# Patient Record
Sex: Male | Born: 1985 | Race: Black or African American | Hispanic: No | Smoking: Current every day smoker
Health system: Southern US, Community
[De-identification: ages and names within clinical notes are randomized; demographics above are authoritative.]

## PROBLEM LIST (undated history)

## (undated) DIAGNOSIS — J40 Bronchitis, not specified as acute or chronic: Secondary | ICD-10-CM

## (undated) DIAGNOSIS — I10 Essential (primary) hypertension: Secondary | ICD-10-CM

## (undated) DIAGNOSIS — Z973 Presence of spectacles and contact lenses: Secondary | ICD-10-CM

## (undated) DIAGNOSIS — L309 Dermatitis, unspecified: Secondary | ICD-10-CM

## (undated) DIAGNOSIS — M1990 Unspecified osteoarthritis, unspecified site: Secondary | ICD-10-CM

## (undated) DIAGNOSIS — F4310 Post-traumatic stress disorder, unspecified: Secondary | ICD-10-CM

## (undated) DIAGNOSIS — K604 Rectal fistula: Secondary | ICD-10-CM

## (undated) DIAGNOSIS — R011 Cardiac murmur, unspecified: Secondary | ICD-10-CM

## (undated) DIAGNOSIS — G43909 Migraine, unspecified, not intractable, without status migrainosus: Secondary | ICD-10-CM

## (undated) DIAGNOSIS — R21 Rash and other nonspecific skin eruption: Secondary | ICD-10-CM

## (undated) HISTORY — PX: HERNIA REPAIR: SHX51

---

## 2010-03-16 ENCOUNTER — Emergency Department (HOSPITAL_COMMUNITY): Admission: EM | Admit: 2010-03-16 | Discharge: 2010-03-16 | Payer: Self-pay | Admitting: Emergency Medicine

## 2010-05-24 ENCOUNTER — Emergency Department (HOSPITAL_COMMUNITY): Admission: EM | Admit: 2010-05-24 | Discharge: 2010-05-24 | Payer: Self-pay | Admitting: Emergency Medicine

## 2010-07-24 ENCOUNTER — Emergency Department (HOSPITAL_COMMUNITY): Admission: EM | Admit: 2010-07-24 | Discharge: 2010-07-24 | Payer: Self-pay | Admitting: Family Medicine

## 2010-11-20 ENCOUNTER — Inpatient Hospital Stay (INDEPENDENT_AMBULATORY_CARE_PROVIDER_SITE_OTHER)
Admission: RE | Admit: 2010-11-20 | Discharge: 2010-11-20 | Disposition: A | Discharge status: Home / Self Care | Source: Ambulatory Visit | Attending: Emergency Medicine | Admitting: Emergency Medicine

## 2010-11-20 DIAGNOSIS — B356 Tinea cruris: Secondary | ICD-10-CM

## 2010-11-20 DIAGNOSIS — S60219A Contusion of unspecified wrist, initial encounter: Secondary | ICD-10-CM

## 2018-10-28 ENCOUNTER — Ambulatory Visit (HOSPITAL_COMMUNITY)
Admission: EM | Admit: 2018-10-28 | Discharge: 2018-10-28 | Disposition: A | Discharge status: Home / Self Care | Payer: No Typology Code available for payment source | Attending: Family Medicine | Admitting: Family Medicine

## 2018-10-28 ENCOUNTER — Other Ambulatory Visit: Payer: Self-pay

## 2018-10-28 ENCOUNTER — Encounter (HOSPITAL_COMMUNITY): Payer: Self-pay

## 2018-10-28 DIAGNOSIS — L739 Follicular disorder, unspecified: Secondary | ICD-10-CM | POA: Diagnosis not present

## 2018-10-28 MED ORDER — SULFAMETHOXAZOLE-TRIMETHOPRIM 800-160 MG PO TABS: 1.0000 | ORAL_TABLET | Freq: Two times a day (BID) | ORAL | 0 refills | Status: AC

## 2018-10-28 NOTE — ED Triage Notes (Signed)
Pt cc abscess right side of his buttock since Monday.

## 2018-10-28 NOTE — ED Notes (Signed)
Patient able to ambulate independently  

## 2018-10-29 NOTE — ED Provider Notes (Signed)
  [facility]   [id number] 10/28/18 Arrival Time: 1631  ASSESSMENT & PLAN:  1. Folliculitis    No sign of an abscess requiring I&D today. Discussed.  Will start: Meds ordered this encounter  Medications  . sulfamethoxazole-trimethoprim (BACTRIM DS,SEPTRA DS) 800-160 MG tablet    Sig: Take 1 tablet by mouth 2 (two) times daily for 10 days.    Dispense:  20 tablet    Refill:  0   OTC analgesics as needed.  Follow-up Information    [facility].   Specialty:  Urgent Care Why:  If symptoms worsen. Contact information: [address] [place] [address] [phone]         Reviewed expectations re: course of current medical issues. Questions answered. Outlined signs and symptoms indicating need for more acute intervention. Patient verbalized understanding. After Visit Summary given.   SUBJECTIVE:  Jake Bonilla is a 33 y.o. male who presents with a possible infection of his inner R buttock. Onset gradual, approximately 2 days ago without active drainage and without active bleeding. Mild pain. Symptoms have stabilized since beginning. Fever: absent. OTC/home treatment: none.  ROS: As per HPI.  OBJECTIVE:  Vitals:   10/28/18 1713 10/28/18 1717  BP:  (!) 155/80  Pulse:  68  Resp:  18  Temp:  98.3 F (36.8 C)  TempSrc:  Oral  SpO2:  100%  Weight: 104.3 kg      General appearance: alert; no distress Skin: R inner buttock with two small indurations approx 0.5cm in size; no fluctuance; mildly tender to touch; no active drainage or bleeding Psychological: alert and cooperative; normal mood and affect  No Known Allergies   Social History   Socioeconomic History  . Marital status: Married    Spouse name: Not on file  . Number of children: Not on file  . Years of education: Not on file  . Highest education level: Not on file  Occupational History  . Not on file  Social Needs  . Financial resource  strain: Not on file  . Food insecurity:    Worry: Not on file    Inability: Not on file  . Transportation needs:    Medical: Not on file    Non-medical: Not on file  Tobacco Use  . Smoking status: Current Every Day Smoker    Packs/day: 0.50  . Smokeless tobacco: Never Used  Substance and Sexual Activity  . Alcohol use: Never    Frequency: Never  . Drug use: Never  . Sexual activity: Not on file  Lifestyle  . Physical activity:    Days per week: Not on file    Minutes per session: Not on file  . Stress: Not on file  Relationships  . Social connections:    Talks on phone: Not on file    Gets together: Not on file    Attends religious service: Not on file    Active member of club or organization: Not on file    Attends meetings of clubs or organizations: Not on file    Relationship status: Not on file  Other Topics Concern  . Not on file  Social History Narrative  . Not on file   History reviewed. No pertinent family history. History reviewed. No pertinent surgical history.         Mardella Layman, MD 10/29/18 [phone]

## 2019-04-14 ENCOUNTER — Ambulatory Visit: Payer: Self-pay | Admitting: Surgery

## 2019-04-14 NOTE — H&P (View-Only) (Signed)
Luay Balding Documented: 04/14/2019 8:38 AM Location: [facility] Patient #: [id number] DOB: 12-04-1985 Separated / Language: Cleophus Molt / Race: Black or African American Male  History of Present Illness Adin Hector MD; 04/14/2019 1:51 PM) The patient is a 33 year old male who presents with a subcutaneous abscess. 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 He is seen dermatology. He has been treated with steroid creams and topical and oral antibiotics with persistent drainage. Surgical consultation requested. Feels like he's had subcutaneous absences intermittently over decade. However he has persistent recurrent one on his right inner buttock cheek. His reading of 100th maybe he had hidradenitis. No problems in his armpits or in his groins. He does feel a little painful cyst to the right of the scrotum in his inner thigh crease. He is a smoker but he is trying to cut back down markedly. Usually moves his bowels twice a day. No diabetes. Think he's been given topical steroids. Has not been any oral steroids in a while. He is not diabetic. No evidence of constipation or diarrhea.  No personal nor family history of GI/colon cancer, inflammatory bowel disease, irritable bowel syndrome, allergy such as Celiac Sprue, dietary/dairy problems, colitis, ulcers nor gastritis. No recent sick contacts/gastroenteritis. No travel outside the country. No changes in diet. No dysphagia to solids or liquids. No significant heartburn or reflux. No hematochezia, hematemesis, coffee ground emesis. No evidence of prior gastric/peptic ulceration.  (Review of systems as stated in this history (HPI) or in the review of systems. Otherwise all other 12 point ROS are negative) `  ` `   Past Surgical History (Tanisha A. Owens Shark, [place]; 04/14/2019 8:38 AM) No pertinent past surgical history  Diagnostic Studies History (Tanisha A. Owens Shark, [place]; 04/14/2019 8:38 AM) Colonoscopy never  Allergies (Tanisha A. Owens Shark, [place]; 04/14/2019 8:39 AM) No Known Drug Allergies [04/14/2019]: Allergies Reconciled  Medication History (Tanisha A. Owens Shark, [place]; 04/14/2019 8:39 AM) No Current Medications Medications Reconciled  Social History (Tanisha A. Owens Shark, [place]; 04/14/2019 8:38 AM) Caffeine use Tea. No drug use Tobacco use Current some day smoker.  Family History (Tanisha A. Owens Shark, [place]; 04/14/2019 8:38 AM) Alcohol Abuse Father. Diabetes Mellitus Father, Mother. Hypertension Father, Mother.  Other Problems (Tanisha A. Owens Shark, [place]; 04/14/2019 8:38 AM) Alcohol Abuse Anxiety Disorder Arthritis Back Pain Depression High blood pressure Migraine Headache     Review of Systems (Tanisha A. Brown RMA; 04/14/2019 8:38 AM) General Present- Night Sweats and Weight Gain. Not Present- Appetite Loss, Chills, Fatigue, Fever and Weight Loss. Skin Present- Hives and Rash. Not Present- Change in Wart/Mole, Dryness, Jaundice, New Lesions, Non-Healing Wounds and Ulcer. HEENT Present- Seasonal Allergies and Wears glasses/contact lenses. Not Present- Earache, Hearing Loss, Hoarseness, Nose Bleed, Oral Ulcers, Ringing in the Ears, Sinus Pain, Sore Throat, Visual Disturbances and Yellow Eyes. Respiratory Present- Snoring. Not Present- Bloody sputum, Chronic Cough, Difficulty Breathing and Wheezing. Breast Not Present- Breast Mass, Breast Pain, Nipple Discharge and Skin Changes. Cardiovascular Not Present- Chest Pain, Difficulty Breathing Lying Down, Leg Cramps, Palpitations, Rapid Heart Rate, Shortness of Breath and Swelling of Extremities. Gastrointestinal Not Present- Abdominal Pain, Bloating, Bloody Stool, Change in Bowel Habits, Chronic diarrhea, Constipation, Difficulty Swallowing,  Excessive gas, Gets full quickly at meals, Hemorrhoids, Indigestion, Nausea, Rectal Pain and Vomiting. Male Genitourinary Not  Present- Blood in Urine, Change in Urinary Stream, Frequency, Impotence, Nocturia, Painful Urination, Urgency and Urine Leakage. Musculoskeletal Not Present- Back Pain, Joint Pain, Joint Stiffness, Muscle Pain, Muscle Weakness and Swelling of Extremities. Neurological Not Present- Decreased Memory, Fainting, Headaches, Numbness, Seizures, Tingling, Tremor, Trouble walking and Weakness. Psychiatric Present- Anxiety. Not Present- Bipolar, Change in Sleep Pattern, Depression, Fearful and Frequent crying. Endocrine Not Present- Cold Intolerance, Excessive Hunger, Hair Changes, Heat Intolerance, Hot flashes and New Diabetes. Hematology Not Present- Blood Thinners, Easy Bruising, Excessive bleeding, Gland problems, HIV and Persistent Infections.  Vitals (Tanisha A. Brown RMA; 04/14/2019 8:39 AM) 04/14/2019 8:39 AM Weight: 231.2 lb Height: 72in Body Surface Area: 2.27 m Body Mass Index: 31.36 kg/m  Temp.: 97.26F  Pulse: 77 (Regular)  BP: 124/86 (Sitting, Left Arm, Standard)        Physical Exam Ardeth Sportsman(Izumi Mixon C. Clarise Chacko MD; 04/14/2019 9:19 AM)  General Mental Status-Alert. General Appearance-Not in acute distress, Not Sickly. Orientation-Oriented X3. Hydration-Well hydrated. Voice-Normal.  Integumentary Global Assessment Upon inspection and palpation of skin surfaces of the - Axillae: non-tender, no inflammation or ulceration, no drainage. and Distribution of scalp and body hair is normal. General Characteristics Temperature - normal warmth is noted.  Head and Neck Head-normocephalic, atraumatic with no lesions or palpable masses. Face Global Assessment - atraumatic, no absence of expression. Neck Global Assessment - no abnormal movements, no bruit auscultated on the right, no bruit auscultated on the left, no decreased range of motion,  non-tender. Trachea-midline. Thyroid Gland Characteristics - non-tender. Note: Wears glasses. Vision corrected  Eye Eyeball - Left-Extraocular movements intact, No Nystagmus - Left. Eyeball - Right-Extraocular movements intact, No Nystagmus - Right. Cornea - Left-No Hazy - Left. Cornea - Right-No Hazy - Right. Sclera/Conjunctiva - Left-No scleral icterus, No Discharge - Left. Sclera/Conjunctiva - Right-No scleral icterus, No Discharge - Right. Pupil - Left-Direct reaction to light normal. Pupil - Right-Direct reaction to light normal.  ENMT Ears Pinna - Left - no drainage observed, no generalized tenderness observed. Pinna - Right - no drainage observed, no generalized tenderness observed. Nose and Sinuses External Inspection of the Nose - no destructive lesion observed. Inspection of the nares - Left - quiet respiration. Inspection of the nares - Right - quiet respiration. Mouth and Throat Lips - Upper Lip - no fissures observed, no pallor noted. Lower Lip - no fissures observed, no pallor noted. Nasopharynx - no discharge present. Oral Cavity/Oropharynx - Tongue - no dryness observed. Oral Mucosa - no cyanosis observed. Hypopharynx - no evidence of airway distress observed.  Chest and Lung Exam Inspection Movements - Normal and Symmetrical. Accessory muscles - No use of accessory muscles in breathing. Palpation Palpation of the chest reveals - Non-tender. Auscultation Breath sounds - Normal and Clear.  Breast Note: No masses or gynecomastia. No axillary sores or lesions or folliculitis. No evidence of hidradenitis.  Cardiovascular Auscultation Rhythm - Regular. Murmurs & Other Heart Sounds - Auscultation of the heart reveals - No Murmurs and No Systolic Clicks.  Abdomen Inspection Inspection of the abdomen reveals - No Visible peristalsis and No Abnormal pulsations. Umbilicus - No Bleeding, No Urine drainage. Palpation/Percussion Palpation and  Percussion of the abdomen reveal - Soft, Non Tender, No Rebound tenderness, No Rigidity (guarding) and No Cutaneous hyperesthesia. Note: Abdomen soft. Nontender. Not distended. No umbilical or incisional hernias. No guarding.  Male Genitourinary Sexual Maturity Tanner 5 - Adult hair pattern and Adult penile size and shape. Note: No inguinal hernias. Normal external genitalia. Questionable scarring in right  scrotal crease 2 x 1 cm. No opening or drainage. No evidence of hydradenitis or chronic folliculitis. Moderate moister consistent with chronic swelling. No cellulitis or rash. No warts. Epididymi, testes, and spermatic cords normal without any masses.  Rectal Note: Right anterior perirectal scarring 15 mm region about 4 cm from the anal verge. No active drainage or sinuses. No obvious cord felt. No pilonidal disease. No fissure. No abscess. Normal sphincter tone. Sensitive but tolerates digital exam. Soft hemorrhoidal piles. No prolapse. No bleeding. Prostate smooth and normal.  Peripheral Vascular Upper Extremity Inspection - Left - No Cyanotic nailbeds - Left, Not Ischemic. Inspection - Right - No Cyanotic nailbeds - Right, Not Ischemic.  Neurologic Neurologic evaluation reveals -normal attention span and ability to concentrate, able to name objects and repeat phrases. Appropriate fund of knowledge , normal sensation and normal coordination. Mental Status Affect - not angry, not paranoid. Cranial Nerves-Normal Bilaterally. Gait-Normal.  Neuropsychiatric Mental status exam performed with findings of-able to articulate well with normal speech/language, rate, volume and coherence, thought content normal with ability to perform basic computations and apply abstract reasoning and no evidence of hallucinations, delusions, obsessions or homicidal/suicidal ideation.  Musculoskeletal Global Assessment Spine, Ribs and Pelvis - no instability, subluxation or laxity. Right Upper  Extremity - no instability, subluxation or laxity.  Lymphatic Head & Neck  General Head & Neck Lymphatics: Bilateral - Description - No Localized lymphadenopathy. Axillary  General Axillary Region: Bilateral - Description - No Localized lymphadenopathy. Femoral & Inguinal  Generalized Femoral & Inguinal Lymphatics: Left - Description - No Localized lymphadenopathy. Right - Description - No Localized lymphadenopathy.    Assessment & Plan Ardeth Sportsman(Chike Farrington C. 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 Hard to say. Also chronically inflamed area between right scrotum and inner thigh suspicious for chronic cyst as well. No other evidence of disease, arguing against hidradenitis  I recommended operative exploration. Also check out the right scrotal crease and make sure there is nothing there. Possible fistulotomy versus lift repair versus excision. Most likely with methylene blue injection probing to see if there is something there or not. He is frustrated by the recurrent infections with interested in proceeding.  I recommend that he add powders to his regimen to help dry out the perineum since a hot sweaty tissues tend to get more infected. There is nothing here that suggests hydradenitis or any significant burden at this time, so I'm skeptical doing doxycycline and clindamycin gel.  Current Plans Pt Education - [facility] Abscess/Fistula (AT): discussed with patient and provided information. The anatomy & physiology of the anorectal region was discussed. We discussed the pathophysiology of anorectal abscess and fistula. Differential diagnosis was discussed. Natural history progression was discussed. I stressed the importance of a bowel regimen to have daily soft bowel movements to minimize progression of disease.  The patient's condition is not adequately  controlled. Non-operative treatment has not healed the fistula. Therefore, I recommended examination under anaesthesia to confirm the diagnosis and treat the fistula. I discussed techniques that may be required such as fistulotomy, ligation by LIFT technique, and/or seton placement. Benefits & alternatives discussed. I noted a good likelihood this will help address the problem, but sometimes repeat operations and prolonged healing times may occur. Risks such as bleeding, pain, recurrence, reoperation, incontinence, heart attack, death, and other risks were discussed.  Educational handouts further explaining the pathology, treatment options, and bowel regimen were  given. The patient expressed understanding & wishes to proceed. We will work to coordinate surgery for a mutually convenient time.  You are being scheduled for surgery- Our schedulers will call you.  You should hear from our office's scheduling department within 5 working days about the location, date, and time of surgery. We try to make accommodations for patient's preferences in scheduling surgery, but sometimes the OR schedule or the surgeon's schedule prevents us from making those accommodations.  If you have not heard from our office [phone]) in 5 working days, call the office and ask for your surgeon's nurse.  If you have other questions about your diagnosis, plan, or surgery, call the office and ask for your surgeon's nurse.   ENCOUNTER FOR PREOPERATIVE EXAMINATION FOR GENERAL SURGICAL PROCEDURE (Z01.818)  Current Plans Pt Education - [facility] Rectal Prep for Anorectal outpatient/office surgery: discussed with patient and provided information. Pt Education - [facility] Rectal Surgery HCI ([facility]): discussed with patient and provided information.  TOBACCO ABUSE (Z72.0)  Current Plans Pt Education - [facility] STOP SMOKING!  Ardeth SportsmanSteven C. Jennings Stirling, MD, FACS, MASCRS Gastrointestinal and Minimally Invasive Surgery    [address] [place] [id number] [phone] Main / Paging [phone] Fax

## 2019-04-14 NOTE — H&P (Signed)
Jake Bonilla Documented: 04/14/2019 8:38 AM Location: [facility] Patient #: [id number] DOB: 12-04-1985 Separated / Language: Jake Bonilla / Race: Black or African American Male  History of Present Illness Jake Hector Bonilla; 04/14/2019 1:51 PM) The patient is a 33 year old male who presents with a subcutaneous abscess. 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 He is seen dermatology. He has been treated with steroid creams and topical and oral antibiotics with persistent drainage. Surgical consultation requested. Feels like he's had subcutaneous absences intermittently over decade. However he has persistent recurrent one on his right inner buttock cheek. His reading of 100th maybe he had hidradenitis. No problems in his armpits or in his groins. He does feel a little painful cyst to the right of the scrotum in his inner thigh crease. He is a smoker but he is trying to cut back down markedly. Usually moves his bowels twice a day. No diabetes. Think he's been given topical steroids. Has not been any oral steroids in a while. He is not diabetic. No evidence of constipation or diarrhea.  No personal nor family history of GI/colon cancer, inflammatory bowel disease, irritable bowel syndrome, allergy such as Celiac Sprue, dietary/dairy problems, colitis, ulcers nor gastritis. No recent sick contacts/gastroenteritis. No travel outside the country. No changes in diet. No dysphagia to solids or liquids. No significant heartburn or reflux. No hematochezia, hematemesis, coffee ground emesis. No evidence of prior gastric/peptic ulceration.  (Review of systems as stated in this history (HPI) or in the review of systems. Otherwise all other 12 point ROS are negative) `  ` `   Past Surgical History (Jake Bonilla, [place]; 04/14/2019 8:38 AM) No pertinent past surgical history  Diagnostic Studies History (Jake Bonilla, [place]; 04/14/2019 8:38 AM) Colonoscopy never  Allergies (Jake Bonilla, [place]; 04/14/2019 8:39 AM) No Known Drug Allergies [04/14/2019]: Allergies Reconciled  Medication History (Jake Bonilla, [place]; 04/14/2019 8:39 AM) No Current Medications Medications Reconciled  Social History (Jake Bonilla, [place]; 04/14/2019 8:38 AM) Caffeine use Tea. No drug use Tobacco use Current some day smoker.  Family History (Jake Bonilla, [place]; 04/14/2019 8:38 AM) Alcohol Abuse Father. Diabetes Mellitus Father, Mother. Hypertension Father, Mother.  Other Problems (Jake Bonilla, [place]; 04/14/2019 8:38 AM) Alcohol Abuse Anxiety Disorder Arthritis Back Pain Depression High blood pressure Migraine Headache     Review of Systems (Jake Bonilla; 04/14/2019 8:38 AM) General Present- Night Sweats and Weight Gain. Not Present- Appetite Loss, Chills, Fatigue, Fever and Weight Loss. Skin Present- Hives and Rash. Not Present- Change in Wart/Mole, Dryness, Jaundice, New Lesions, Non-Healing Wounds and Ulcer. HEENT Present- Seasonal Allergies and Wears glasses/contact lenses. Not Present- Earache, Hearing Loss, Hoarseness, Nose Bleed, Oral Ulcers, Ringing in the Ears, Sinus Pain, Sore Throat, Visual Disturbances and Yellow Eyes. Respiratory Present- Snoring. Not Present- Bloody sputum, Chronic Cough, Difficulty Breathing and Wheezing. Breast Not Present- Breast Mass, Breast Pain, Nipple Discharge and Skin Changes. Cardiovascular Not Present- Chest Pain, Difficulty Breathing Lying Down, Leg Cramps, Palpitations, Rapid Heart Rate, Shortness of Breath and Swelling of Extremities. Gastrointestinal Not Present- Abdominal Pain, Bloating, Bloody Stool, Change in Bowel Habits, Chronic diarrhea, Constipation, Difficulty Swallowing,  Excessive gas, Gets full quickly at meals, Hemorrhoids, Indigestion, Nausea, Rectal Pain and Vomiting. Male Genitourinary Not  Present- Blood in Urine, Change in Urinary Stream, Frequency, Impotence, Nocturia, Painful Urination, Urgency and Urine Leakage. Musculoskeletal Not Present- Back Pain, Joint Pain, Joint Stiffness, Muscle Pain, Muscle Weakness and Swelling of Extremities. Neurological Not Present- Decreased Memory, Fainting, Headaches, Numbness, Seizures, Tingling, Tremor, Trouble walking and Weakness. Psychiatric Present- Anxiety. Not Present- Bipolar, Change in Sleep Pattern, Depression, Fearful and Frequent crying. Endocrine Not Present- Cold Intolerance, Excessive Hunger, Hair Changes, Heat Intolerance, Hot flashes and New Diabetes. Hematology Not Present- Blood Thinners, Easy Bruising, Excessive bleeding, Gland problems, HIV and Persistent Infections.  Vitals (Jake Bonilla; 04/14/2019 8:39 AM) 04/14/2019 8:39 AM Weight: 231.2 lb Height: 72in Body Surface Area: 2.27 m Body Mass Index: 31.36 kg/m  Temp.: 97.26F  Pulse: 77 (Regular)  BP: 124/86 (Sitting, Left Arm, Standard)        Physical Exam Jake Bonilla(Jake Bonilla; 04/14/2019 9:19 AM)  General Mental Status-Alert. General Appearance-Not in acute distress, Not Sickly. Orientation-Oriented X3. Hydration-Well hydrated. Voice-Normal.  Integumentary Global Assessment Upon inspection and palpation of skin surfaces of the - Axillae: non-tender, no inflammation or ulceration, no drainage. and Distribution of scalp and body hair is normal. General Characteristics Temperature - normal warmth is noted.  Head and Neck Head-normocephalic, atraumatic with no lesions or palpable masses. Face Global Assessment - atraumatic, no absence of expression. Neck Global Assessment - no abnormal movements, no bruit auscultated on the right, no bruit auscultated on the left, no decreased range of motion,  non-tender. Trachea-midline. Thyroid Gland Characteristics - non-tender. Note: Wears glasses. Vision corrected  Eye Eyeball - Left-Extraocular movements intact, No Nystagmus - Left. Eyeball - Right-Extraocular movements intact, No Nystagmus - Right. Cornea - Left-No Hazy - Left. Cornea - Right-No Hazy - Right. Sclera/Conjunctiva - Left-No scleral icterus, No Discharge - Left. Sclera/Conjunctiva - Right-No scleral icterus, No Discharge - Right. Pupil - Left-Direct reaction to light normal. Pupil - Right-Direct reaction to light normal.  ENMT Ears Pinna - Left - no drainage observed, no generalized tenderness observed. Pinna - Right - no drainage observed, no generalized tenderness observed. Nose and Sinuses External Inspection of the Nose - no destructive lesion observed. Inspection of the nares - Left - quiet respiration. Inspection of the nares - Right - quiet respiration. Mouth and Throat Lips - Upper Lip - no fissures observed, no pallor noted. Lower Lip - no fissures observed, no pallor noted. Nasopharynx - no discharge present. Oral Cavity/Oropharynx - Tongue - no dryness observed. Oral Mucosa - no cyanosis observed. Hypopharynx - no evidence of airway distress observed.  Chest and Lung Exam Inspection Movements - Normal and Symmetrical. Accessory muscles - No use of accessory muscles in breathing. Palpation Palpation of the chest reveals - Non-tender. Auscultation Breath sounds - Normal and Clear.  Breast Note: No masses or gynecomastia. No axillary sores or lesions or folliculitis. No evidence of hidradenitis.  Cardiovascular Auscultation Rhythm - Regular. Murmurs & Other Heart Sounds - Auscultation of the heart reveals - No Murmurs and No Systolic Clicks.  Abdomen Inspection Inspection of the abdomen reveals - No Visible peristalsis and No Abnormal pulsations. Umbilicus - No Bleeding, No Urine drainage. Palpation/Percussion Palpation and  Percussion of the abdomen reveal - Soft, Non Tender, No Rebound tenderness, No Rigidity (guarding) and No Cutaneous hyperesthesia. Note: Abdomen soft. Nontender. Not distended. No umbilical or incisional hernias. No guarding.  Male Genitourinary Sexual Maturity Tanner 5 - Adult hair pattern and Adult penile size and shape. Note: No inguinal hernias. Normal external genitalia. Questionable scarring in right  scrotal crease 2 x 1 cm. No opening or drainage. No evidence of hydradenitis or chronic folliculitis. Moderate moister consistent with chronic swelling. No cellulitis or rash. No warts. Epididymi, testes, and spermatic cords normal without any masses.  Rectal Note: Right anterior perirectal scarring 15 mm region about 4 cm from the anal verge. No active drainage or sinuses. No obvious cord felt. No pilonidal disease. No fissure. No abscess. Normal sphincter tone. Sensitive but tolerates digital exam. Soft hemorrhoidal piles. No prolapse. No bleeding. Prostate smooth and normal.  Peripheral Vascular Upper Extremity Inspection - Left - No Cyanotic nailbeds - Left, Not Ischemic. Inspection - Right - No Cyanotic nailbeds - Right, Not Ischemic.  Neurologic Neurologic evaluation reveals -normal attention span and ability to concentrate, able to name objects and repeat phrases. Appropriate fund of knowledge , normal sensation and normal coordination. Mental Status Affect - not angry, not paranoid. Cranial Nerves-Normal Bilaterally. Gait-Normal.  Neuropsychiatric Mental status exam performed with findings of-able to articulate well with normal speech/language, rate, volume and coherence, thought content normal with ability to perform basic computations and apply abstract reasoning and no evidence of hallucinations, delusions, obsessions or homicidal/suicidal ideation.  Musculoskeletal Global Assessment Spine, Ribs and Pelvis - no instability, subluxation or laxity. Right Upper  Extremity - no instability, subluxation or laxity.  Lymphatic Head & Neck  General Head & Neck Lymphatics: Bilateral - Description - No Localized lymphadenopathy. Axillary  General Axillary Region: Bilateral - Description - No Localized lymphadenopathy. Femoral & Inguinal  Generalized Femoral & Inguinal Lymphatics: Left - Description - No Localized lymphadenopathy. Right - Description - No Localized lymphadenopathy.    Assessment & Plan Jake Bonilla(Garnet Overfield C. 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 Hard to say. Also chronically inflamed area between right scrotum and inner thigh suspicious for chronic cyst as well. No other evidence of disease, arguing against hidradenitis  I recommended operative exploration. Also check out the right scrotal crease and make sure there is nothing there. Possible fistulotomy versus lift repair versus excision. Most likely with methylene blue injection probing to see if there is something there or not. He is frustrated by the recurrent infections with interested in proceeding.  I recommend that he add powders to his regimen to help dry out the perineum since a hot sweaty tissues tend to get more infected. There is nothing here that suggests hydradenitis or any significant burden at this time, so I'm skeptical doing doxycycline and clindamycin gel.  Current Plans Pt Education - CCS Abscess/Fistula (AT): discussed with patient and provided information. The anatomy & physiology of the anorectal region was discussed. We discussed the pathophysiology of anorectal abscess and fistula. Differential diagnosis was discussed. Natural history progression was discussed. I stressed the importance of a bowel regimen to have daily soft bowel movements to minimize progression of disease.  The patient's condition is not adequately  controlled. Non-operative treatment has not healed the fistula. Therefore, I recommended examination under anaesthesia to confirm the diagnosis and treat the fistula. I discussed techniques that may be required such as fistulotomy, ligation by LIFT technique, and/or seton placement. Benefits & alternatives discussed. I noted a good likelihood this will help address the problem, but sometimes repeat operations and prolonged healing times may occur. Risks such as bleeding, pain, recurrence, reoperation, incontinence, heart attack, death, and other risks were discussed.  Educational handouts further explaining the pathology, treatment options, and bowel regimen were  given. The patient expressed understanding & wishes to proceed. We will work to coordinate surgery for a mutually convenient time.  You are being scheduled for surgery- Our schedulers will call you.  You should hear from our office's scheduling department within 5 working days about the location, date, and time of surgery. We try to make accommodations for patient's preferences in scheduling surgery, but sometimes the OR schedule or the surgeon's schedule prevents us from making those accommodations.  If you have not heard from our office ([phone]) in 5 working days, call the office and ask for your surgeon's nurse.  If you have other questions about your diagnosis, plan, or surgery, call the office and ask for your surgeon's nurse.   ENCOUNTER FOR PREOPERATIVE EXAMINATION FOR GENERAL SURGICAL PROCEDURE (Z01.818)  Current Plans Pt Education - CCS Rectal Prep for Anorectal outpatient/office surgery: discussed with patient and provided information. Pt Education - [facility]CS Rectal Surgery HCI (Billi Bright): discussed with patient and provided information.  TOBACCO ABUSE (Z72.0)  Current Plans Pt Education - CCS STOP SMOKING!  Toryn Mcclinton C. Dequavius Kuhner, Bonilla, FACS, MASCRS Gastrointestinal and Minimally Invasive Surgery    [address] [place], [place] [address] [phone] Main / Paging [phone] Fax   

## 2019-04-20 NOTE — Patient Instructions (Addendum)
YOU NEED TO HAVE A COVID 19 TEST ON 04-21-2019 AT 11:30 AM.  THIS TEST MUST BE DONE BEFORE SURGERY, COME TO [facility], [place] , [address]. ONCE YOUR COVID TEST IS COMPLETED, PLEASE BEGIN THE QUARANTINE INSTRUCTIONS AS OUTLINED IN YOUR HANDOUT.                 Turki Tapanes      Your procedure is scheduled on: 04-24-2019    Report to [facility] Main  Entrance   Report to [place] AT 5:30 AM   1 VISITOR IS ALLOWED TO WAIT IN WAITING ROOM  ONLY DAY OF YOUR SURGERY.    Call this number if you have problems the morning of surgery [phone]    Remember: [place], NO CHEWING GUM CANDY OR MINTS.     RECTAL PREP PER SURGEONS ORDERS   Obtain a bottle of Milk of Magnesia at a [place]:   Switch to drinking liquids or pureed foods only  Drink plenty of liquids all day to avoid getting dehydrated   10:00am: Take 2 oz (4 tablespoons) Milk of Magnesia.   2:00pm: Take 2 oz (4 tablespoons) Milk of Magnesia.   Midnight: Do not eat anything solid after bedtime (midnight) the night before your surgery. BUT DO drink plenty of clear liquids (Water, Gatorade, juice, soda, coffee, tea, broths, etc.) up to 2 hours prior to surgery to avoid getting dehydrated.   MORNING OF SURGERY   Remember to not to eat anything solid that morning  Hold or take medications as recommended by the hospital staff at your Preoperative visit   Stop drinking liquids before you leave the house (>3 hours prior to surgery)    If you have questions or problems,  please call [facility]  to speak to someone in the clinic department at our office    [place] Excluded  Coffee and tea, regular and decaf                             liquids that you cannot  Plain Jell-O any favor  except red or purple                                           see through such as: Fruit ices (not with fruit pulp)                                     milk, soups, orange juice  Iced Popsicles                                    All solid food Carbonated beverages,  regular and diet                                    Cranberry, grape and apple juices Sports drinks like Gatorade Lightly seasoned clear broth or consume(fat free) Sugar, honey syrup  Sample Menu Breakfast                                Lunch                                     Supper Cranberry juice                    Beef broth                            Chicken broth Jell-O                                     Grape juice                           Apple juice Coffee or tea                        Jell-O                                      Popsicle                                                Coffee or tea                        Coffee or tea  _____________________________________________________________________       Take these medicines the morning of surgery with A SIP OF WATER: TYLENOL IF NEEDED                                 You may not have any metal on your body including hair pins and              piercings  Do not wear jewelry, make-up, lotions, powders or perfumes, deodorant                    Men may shave face and neck.   Do not bring valuables to the hospital. [facility] IS NOT             RESPONSIBLE   FOR VALUABLES.  Contacts, dentures or bridgework may not be worn into surgery.      Patients discharged the day of surgery will not be allowed to drive home. IF YOU ARE HAVING SURGERY AND GOING HOME THE SAME DAY, YOU MUST HAVE AN ADULT TO DRIVE YOU HOME AND BE WITH YOU FOR 24 HOURS. YOU MAY GO HOME BY TAXI  OR UBER OR ORTHERWISE, BUT AN ADULT MUST ACCOMPANY YOU HOME AND STAY WITH YOU FOR 24 HOURS.  Name and phone number of your driver:  Special Instructions: N/A              Please read over the  following fact sheets you were given: _____________________________________________________________________             [facility] Pre[facility] Before surgery, you can play an important role.  Because skin is not sterile, your skin needs to be as free of germs as possible.  You can reduce the number of germs on your skin by washing with CHG (chlorahexidine gluconate) soap before surgery.  CHG is an antiseptic cleaner which kills germs and bonds with the skin to continue killing germs even after washing. Please DO NOT use if you have an allergy to CHG or antibacterial soaps.  If your skin becomes reddened/irritated stop using the CHG and inform your nurse when you arrive at Short Stay. Do not shave (including legs and underarms) for at least 48 hours prior to the first CHG shower.  You may shave your face/neck. Please follow these instructions carefully:  1.  Shower with CHG Soap the night before surgery and the  morning of Surgery.  2.  If you choose to wash your hair, wash your hair first as usual with your  normal  shampoo.  3.  After you shampoo, rinse your hair and body thoroughly to remove the  shampoo.                           4.  Use CHG as you would any other liquid soap.  You can apply chg directly  to the skin and wash                       Gently with a scrungie or clean washcloth.  5.  Apply the CHG Soap to your body ONLY FROM THE NECK DOWN.   Do not use on face/ open                           Wound or open sores. Avoid contact with eyes, ears mouth and genitals (private parts).                       Wash face,  Genitals (private parts) with your normal soap.             6.  Wash thoroughly, paying special attention to the area where your surgery  will be performed.  7.  Thoroughly rinse your body with warm water from the neck down.  8.  DO NOT shower/wash with your normal soap after using and rinsing off  the CHG Soap.                9.  Pat yourself dry with a clean  towel.            10.  Wear clean pajamas.            11.  Place clean sheets on your bed the night of your first shower and do not  sleep with pets. Day of Surgery : Do not apply any lotions/deodorants the morning of surgery.  Please wear clean clothes to the hospital/surgery center.  FAILURE TO FOLLOW THESE INSTRUCTIONS MAY RESULT IN THE CANCELLATION OF  YOUR SURGERY PATIENT SIGNATURE_________________________________  NURSE SIGNATURE__________________________________  ________________________________________________________________________

## 2019-04-21 ENCOUNTER — Encounter (INDEPENDENT_AMBULATORY_CARE_PROVIDER_SITE_OTHER): Payer: Self-pay

## 2019-04-21 ENCOUNTER — Other Ambulatory Visit (HOSPITAL_COMMUNITY)
Admission: RE | Admit: 2019-04-21 | Discharge: 2019-04-21 | Disposition: A | Discharge status: Home / Self Care | Payer: No Typology Code available for payment source | Source: Ambulatory Visit | Attending: Surgery | Admitting: Surgery

## 2019-04-21 ENCOUNTER — Other Ambulatory Visit (HOSPITAL_COMMUNITY): Payer: No Typology Code available for payment source

## 2019-04-21 ENCOUNTER — Encounter (HOSPITAL_COMMUNITY): Payer: Self-pay

## 2019-04-21 ENCOUNTER — Other Ambulatory Visit: Payer: Self-pay

## 2019-04-21 ENCOUNTER — Encounter (HOSPITAL_COMMUNITY)
Admission: RE | Admit: 2019-04-21 | Discharge: 2019-04-21 | Disposition: A | Discharge status: Home / Self Care | Payer: No Typology Code available for payment source | Source: Ambulatory Visit | Attending: Surgery | Admitting: Surgery

## 2019-04-21 DIAGNOSIS — L509 Urticaria, unspecified: Secondary | ICD-10-CM | POA: Diagnosis not present

## 2019-04-21 DIAGNOSIS — L02215 Cutaneous abscess of perineum: Secondary | ICD-10-CM | POA: Diagnosis not present

## 2019-04-21 DIAGNOSIS — K603 Anal fistula: Secondary | ICD-10-CM | POA: Insufficient documentation

## 2019-04-21 DIAGNOSIS — I10 Essential (primary) hypertension: Secondary | ICD-10-CM | POA: Diagnosis not present

## 2019-04-21 DIAGNOSIS — M1990 Unspecified osteoarthritis, unspecified site: Secondary | ICD-10-CM | POA: Diagnosis not present

## 2019-04-21 DIAGNOSIS — L738 Other specified follicular disorders: Secondary | ICD-10-CM | POA: Diagnosis not present

## 2019-04-21 DIAGNOSIS — Z20828 Contact with and (suspected) exposure to other viral communicable diseases: Secondary | ICD-10-CM | POA: Insufficient documentation

## 2019-04-21 DIAGNOSIS — L0592 Pilonidal sinus without abscess: Secondary | ICD-10-CM | POA: Diagnosis present

## 2019-04-21 DIAGNOSIS — K604 Rectal fistula: Secondary | ICD-10-CM | POA: Diagnosis not present

## 2019-04-21 DIAGNOSIS — Z8249 Family history of ischemic heart disease and other diseases of the circulatory system: Secondary | ICD-10-CM | POA: Diagnosis not present

## 2019-04-21 DIAGNOSIS — Z01812 Encounter for preprocedural laboratory examination: Secondary | ICD-10-CM | POA: Insufficient documentation

## 2019-04-21 HISTORY — DX: Post-traumatic stress disorder, unspecified: F43.10

## 2019-04-21 HISTORY — DX: Essential (primary) hypertension: I10

## 2019-04-21 HISTORY — DX: Dermatitis, unspecified: L30.9

## 2019-04-21 HISTORY — DX: Bronchitis, not specified as acute or chronic: J40

## 2019-04-21 HISTORY — DX: Cardiac murmur, unspecified: R01.1

## 2019-04-21 LAB — CBC
HCT: 51.1 % (ref 39.0–52.0)
Hemoglobin: 16.6 g/dL (ref 13.0–17.0)
MCH: 29 pg (ref 26.0–34.0)
MCHC: 32.5 g/dL (ref 30.0–36.0)
MCV: 89.2 fL (ref 80.0–100.0)
Platelets: 298 10*3/uL (ref 150–400)
RBC: 5.73 MIL/uL (ref 4.22–5.81)
RDW: 13.1 % (ref 11.5–15.5)
WBC: 10.4 10*3/uL (ref 4.0–10.5)
nRBC: 0 % (ref 0.0–0.2)

## 2019-04-21 LAB — SARS CORONAVIRUS 2 (TAT 6-24 HRS): SARS Coronavirus 2: NEGATIVE

## 2019-04-23 MED ORDER — BUPIVACAINE LIPOSOME 1.3 % IJ SUSP
20.0000 mL | Freq: Once | INTRAMUSCULAR | Status: DC
  Filled 2019-04-23: qty 20

## 2019-04-24 ENCOUNTER — Ambulatory Visit (HOSPITAL_COMMUNITY): Payer: No Typology Code available for payment source | Admitting: Physician Assistant

## 2019-04-24 ENCOUNTER — Ambulatory Visit (HOSPITAL_COMMUNITY): Payer: No Typology Code available for payment source | Admitting: Certified Registered Nurse Anesthetist

## 2019-04-24 ENCOUNTER — Ambulatory Visit (HOSPITAL_COMMUNITY)
Admission: RE | Admit: 2019-04-24 | Discharge: 2019-04-24 | Disposition: A | Discharge status: Home / Self Care | Payer: No Typology Code available for payment source | Source: Other Acute Inpatient Hospital | Attending: Surgery | Admitting: Surgery

## 2019-04-24 ENCOUNTER — Encounter (HOSPITAL_COMMUNITY): Payer: Self-pay | Admitting: *Deleted

## 2019-04-24 ENCOUNTER — Other Ambulatory Visit: Payer: Self-pay

## 2019-04-24 ENCOUNTER — Encounter (HOSPITAL_COMMUNITY)
Admission: RE | Disposition: A | Discharge status: Home / Self Care | Payer: Self-pay | Source: Other Acute Inpatient Hospital | Attending: Surgery

## 2019-04-24 DIAGNOSIS — K604 Rectal fistula: Secondary | ICD-10-CM | POA: Insufficient documentation

## 2019-04-24 DIAGNOSIS — L509 Urticaria, unspecified: Secondary | ICD-10-CM | POA: Insufficient documentation

## 2019-04-24 DIAGNOSIS — I10 Essential (primary) hypertension: Secondary | ICD-10-CM | POA: Insufficient documentation

## 2019-04-24 DIAGNOSIS — Z20828 Contact with and (suspected) exposure to other viral communicable diseases: Secondary | ICD-10-CM | POA: Insufficient documentation

## 2019-04-24 DIAGNOSIS — L02215 Cutaneous abscess of perineum: Secondary | ICD-10-CM | POA: Insufficient documentation

## 2019-04-24 DIAGNOSIS — Z8249 Family history of ischemic heart disease and other diseases of the circulatory system: Secondary | ICD-10-CM | POA: Insufficient documentation

## 2019-04-24 DIAGNOSIS — M1990 Unspecified osteoarthritis, unspecified site: Secondary | ICD-10-CM | POA: Insufficient documentation

## 2019-04-24 DIAGNOSIS — L738 Other specified follicular disorders: Secondary | ICD-10-CM | POA: Insufficient documentation

## 2019-04-24 HISTORY — PX: EXCISION OF KELOID: SHX6267

## 2019-04-24 HISTORY — PX: LIGATION OF INTERNAL FISTULA TRACT: SHX6551

## 2019-04-24 HISTORY — PX: RECTAL EXAM UNDER ANESTHESIA: SHX6399

## 2019-04-24 SURGERY — EXAM UNDER ANESTHESIA, RECTUM
Anesthesia: General

## 2019-04-24 MED ORDER — BUPIVACAINE LIPOSOME 1.3 % IJ SUSP
INTRAMUSCULAR | Status: DC | PRN
  Administered 2019-04-24: 20 mL

## 2019-04-24 MED ORDER — DIBUCAINE (PERIANAL) 1 % EX OINT
TOPICAL_OINTMENT | CUTANEOUS | Status: DC | PRN
  Administered 2019-04-24: 1 via RECTAL

## 2019-04-24 MED ORDER — 0.9 % SODIUM CHLORIDE (POUR BTL) OPTIME
TOPICAL | Status: DC | PRN
  Administered 2019-04-24: 1000 mL

## 2019-04-24 MED ORDER — ACETAMINOPHEN 10 MG/ML IV SOLN: 1000.0000 mg | Freq: Once | INTRAVENOUS | Status: DC | PRN

## 2019-04-24 MED ORDER — METHYLENE BLUE 1 % INJ SOLN
INTRAMUSCULAR | Status: DC | PRN
  Administered 2019-04-24: 10 mL via SUBMUCOSAL

## 2019-04-24 MED ORDER — ONDANSETRON HCL 4 MG/2ML IJ SOLN
INTRAMUSCULAR | Status: DC | PRN
  Administered 2019-04-24: 4 mg via INTRAVENOUS

## 2019-04-24 MED ORDER — BUPIVACAINE-EPINEPHRINE (PF) 0.25% -1:200000 IJ SOLN
INTRAMUSCULAR | Status: AC
  Filled 2019-04-24: qty 30

## 2019-04-24 MED ORDER — ONDANSETRON HCL 4 MG/2ML IJ SOLN
INTRAMUSCULAR | Status: AC
  Filled 2019-04-24: qty 2

## 2019-04-24 MED ORDER — METHYLENE BLUE 0.5 % INJ SOLN
INTRAVENOUS | Status: AC
  Filled 2019-04-24: qty 10

## 2019-04-24 MED ORDER — DIBUCAINE (PERIANAL) 1 % EX OINT
TOPICAL_OINTMENT | CUTANEOUS | Status: AC
  Filled 2019-04-24: qty 28

## 2019-04-24 MED ORDER — ROCURONIUM BROMIDE 10 MG/ML (PF) SYRINGE
PREFILLED_SYRINGE | INTRAVENOUS | Status: AC
  Filled 2019-04-24: qty 10

## 2019-04-24 MED ORDER — OXYCODONE HCL 5 MG PO TABS: 5.0000 mg | ORAL_TABLET | Freq: Four times a day (QID) | ORAL | 0 refills | Status: DC | PRN

## 2019-04-24 MED ORDER — CHLORHEXIDINE GLUCONATE CLOTH 2 % EX PADS: 6.0000 | MEDICATED_PAD | Freq: Once | CUTANEOUS | Status: DC

## 2019-04-24 MED ORDER — GABAPENTIN 300 MG PO CAPS
300.0000 mg | ORAL_CAPSULE | ORAL | Status: AC
  Administered 2019-04-24: 06:00:00 300 mg via ORAL
  Filled 2019-04-24: qty 1

## 2019-04-24 MED ORDER — FENTANYL CITRATE (PF) 100 MCG/2ML IJ SOLN
INTRAMUSCULAR | Status: AC
  Filled 2019-04-24: qty 2

## 2019-04-24 MED ORDER — BUPIVACAINE-EPINEPHRINE 0.25% -1:200000 IJ SOLN
INTRAMUSCULAR | Status: DC | PRN
  Administered 2019-04-24: 30 mL

## 2019-04-24 MED ORDER — LACTATED RINGERS IV SOLN
INTRAVENOUS | Status: DC
  Administered 2019-04-24 (×2): via INTRAVENOUS

## 2019-04-24 MED ORDER — MIDAZOLAM HCL 5 MG/5ML IJ SOLN
INTRAMUSCULAR | Status: DC | PRN
  Administered 2019-04-24: 2 mg via INTRAVENOUS

## 2019-04-24 MED ORDER — MEPERIDINE HCL 50 MG/ML IJ SOLN: 6.2500 mg | INTRAMUSCULAR | Status: DC | PRN

## 2019-04-24 MED ORDER — PROMETHAZINE HCL 25 MG/ML IJ SOLN: 6.2500 mg | INTRAMUSCULAR | Status: DC | PRN

## 2019-04-24 MED ORDER — PROPOFOL 10 MG/ML IV BOLUS
INTRAVENOUS | Status: DC | PRN
  Administered 2019-04-24: 60 mg via INTRAVENOUS
  Administered 2019-04-24: 140 mg via INTRAVENOUS

## 2019-04-24 MED ORDER — SODIUM CHLORIDE 0.9 % IV SOLN
2.0000 g | INTRAVENOUS | Status: AC
  Administered 2019-04-24: 2 g via INTRAVENOUS
  Filled 2019-04-24: qty 20

## 2019-04-24 MED ORDER — MIDAZOLAM HCL 2 MG/2ML IJ SOLN
INTRAMUSCULAR | Status: AC
  Filled 2019-04-24: qty 2

## 2019-04-24 MED ORDER — ACETAMINOPHEN 500 MG PO TABS
1000.0000 mg | ORAL_TABLET | ORAL | Status: AC
  Administered 2019-04-24: 1000 mg via ORAL
  Filled 2019-04-24: qty 2

## 2019-04-24 MED ORDER — HYDROMORPHONE HCL 1 MG/ML IJ SOLN: 0.2500 mg | INTRAMUSCULAR | Status: DC | PRN

## 2019-04-24 MED ORDER — FENTANYL CITRATE (PF) 100 MCG/2ML IJ SOLN
INTRAMUSCULAR | Status: DC | PRN
  Administered 2019-04-24: 100 ug via INTRAVENOUS
  Administered 2019-04-24 (×2): 50 ug via INTRAVENOUS

## 2019-04-24 MED ORDER — PROPOFOL 10 MG/ML IV BOLUS
INTRAVENOUS | Status: AC
  Filled 2019-04-24: qty 20

## 2019-04-24 MED ORDER — SUGAMMADEX SODIUM 200 MG/2ML IV SOLN
INTRAVENOUS | Status: DC | PRN
  Administered 2019-04-24: 200 mg via INTRAVENOUS

## 2019-04-24 MED ORDER — CELECOXIB 200 MG PO CAPS
200.0000 mg | ORAL_CAPSULE | ORAL | Status: AC
  Administered 2019-04-24: 200 mg via ORAL
  Filled 2019-04-24: qty 1

## 2019-04-24 MED ORDER — HYDROCODONE-ACETAMINOPHEN 7.5-325 MG PO TABS: 1.0000 | ORAL_TABLET | Freq: Once | ORAL | Status: DC | PRN

## 2019-04-24 MED ORDER — ROCURONIUM BROMIDE 10 MG/ML (PF) SYRINGE
PREFILLED_SYRINGE | INTRAVENOUS | Status: DC | PRN
  Administered 2019-04-24: 60 mg via INTRAVENOUS

## 2019-04-24 MED ORDER — LIDOCAINE 2% (20 MG/ML) 5 ML SYRINGE
INTRAMUSCULAR | Status: DC | PRN
  Administered 2019-04-24: 100 mg via INTRAVENOUS

## 2019-04-24 MED ORDER — METRONIDAZOLE IN NACL 5-0.79 MG/ML-% IV SOLN
500.0000 mg | INTRAVENOUS | Status: AC
  Administered 2019-04-24: 500 mg via INTRAVENOUS
  Filled 2019-04-24: qty 100

## 2019-04-24 MED ORDER — LIDOCAINE 2% (20 MG/ML) 5 ML SYRINGE
INTRAMUSCULAR | Status: AC
  Filled 2019-04-24: qty 5

## 2019-04-24 SURGICAL SUPPLY — 32 items
BLADE SURG 15 STRL LF DISP TIS (BLADE) ×1 IMPLANT
BLADE SURG 15 STRL SS (BLADE) ×2
BRIEF STRETCH FOR OB PAD LRG (UNDERPADS AND DIAPERS) ×3 IMPLANT
COVER SURGICAL LIGHT HANDLE (MISCELLANEOUS) ×3 IMPLANT
COVER WAND RF STERILE (DRAPES) IMPLANT
DRAPE LAPAROTOMY T 102X78X121 (DRAPES) ×3 IMPLANT
DRSG PAD ABDOMINAL 8X10 ST (GAUZE/BANDAGES/DRESSINGS) ×3 IMPLANT
ELECT PENCIL ROCKER SW 15FT (MISCELLANEOUS) ×3 IMPLANT
ELECT REM PT RETURN 15FT ADLT (MISCELLANEOUS) ×3 IMPLANT
GAUZE 4X4 16PLY RFD (DISPOSABLE) ×3 IMPLANT
GAUZE SPONGE 4X4 12PLY STRL (GAUZE/BANDAGES/DRESSINGS) ×3 IMPLANT
GLOVE ECLIPSE 8.0 STRL XLNG CF (GLOVE) ×3 IMPLANT
GLOVE INDICATOR 8.0 STRL GRN (GLOVE) ×3 IMPLANT
GOWN STRL REUS W/TWL XL LVL3 (GOWN DISPOSABLE) ×6 IMPLANT
KIT BASIN OR (CUSTOM PROCEDURE TRAY) ×3 IMPLANT
KIT TURNOVER KIT A (KITS) IMPLANT
NEEDLE HYPO 22GX1.5 SAFETY (NEEDLE) ×3 IMPLANT
PACK BASIC VI WITH GOWN DISP (CUSTOM PROCEDURE TRAY) ×3 IMPLANT
SUCTION FRAZIER HANDLE 12FR (TUBING)
SUCTION TUBE FRAZIER 12FR DISP (TUBING) IMPLANT
SURGILUBE 2OZ TUBE FLIPTOP (MISCELLANEOUS) ×3 IMPLANT
SUT CHROMIC 2 0 SH (SUTURE) IMPLANT
SUT CHROMIC 3 0 SH 27 (SUTURE) IMPLANT
SUT VIC AB 2-0 UR6 27 (SUTURE) ×10 IMPLANT
SWAB COLLECTION DEVICE MRSA (MISCELLANEOUS) IMPLANT
SWAB CULTURE ESWAB REG 1ML (MISCELLANEOUS) IMPLANT
SYR 20ML LL LF (SYRINGE) ×3 IMPLANT
SYR 3ML LL SCALE MARK (SYRINGE) IMPLANT
SYR BULB IRRIGATION 50ML (SYRINGE) IMPLANT
TOWEL OR 17X26 10 PK STRL BLUE (TOWEL DISPOSABLE) ×3 IMPLANT
TOWEL OR NON WOVEN STRL DISP B (DISPOSABLE) ×3 IMPLANT
YANKAUER SUCT BULB TIP 10FT TU (MISCELLANEOUS) ×3 IMPLANT

## 2019-04-24 NOTE — Anesthesia Procedure Notes (Signed)
Procedure Name: Intubation Date/Time: 04/24/2019 7:20 AM Performed by: [place] Baudelia Schroepfer C, CRNA Pre-anesthesia Checklist: Patient identified, Emergency Drugs available, Suction available and Patient being monitored Patient Re-evaluated:Patient Re-evaluated prior to induction Oxygen Delivery Method: Circle system utilized Preoxygenation: Pre-oxygenation with 100% oxygen Induction Type: IV induction Ventilation: Mask ventilation without difficulty Laryngoscope Size: Mac and 4 Grade View: Grade II Tube type: Oral Tube size: 7.5 mm Number of attempts: 1 Airway Equipment and Method: Stylet and Oral airway Placement Confirmation: ETT inserted through vocal cords under direct vision,  positive ETCO2 and breath sounds checked- equal and bilateral Secured at: 22 cm Tube secured with: Tape Dental Injury: Teeth and Oropharynx as per pre-operative assessment

## 2019-04-24 NOTE — Transfer of Care (Signed)
Immediate Anesthesia Transfer of Care Note  Patient: Jake Bonilla  Procedure(s) Performed: ANORECTAL EXAM UNDER ANESTHESIA (N/A ) POSSIBLE EXCISION OF CHRONIC PERINEAL CYSTS; LYFT REPAIR PERIRECTAL FISTULA (N/A ) REPAIR OF PERIRECTAL FISTULA (N/A )  Patient Location: PACU  Anesthesia Type:General  Level of Consciousness: awake, alert  and oriented  Airway & Oxygen Therapy: Patient Spontanous Breathing and Patient connected to face mask oxygen  Post-op Assessment: Report given to RN and Post -op Vital signs reviewed and stable  Post vital signs: Reviewed and stable  Last Vitals:  Vitals Value Taken Time  BP 159/90 04/24/19 0927  Temp 36.4 C 04/24/19 0926  Pulse 99 04/24/19 0929  Resp 18 04/24/19 0929  SpO2 100 % 04/24/19 0929  Vitals shown include unvalidated device data.  Last Pain:  Vitals:   04/24/19 0601  TempSrc:   PainSc: 8       Patients Stated Pain Goal: 4 (67/34/19 3790)  Complications: No apparent anesthesia complications

## 2019-04-24 NOTE — Anesthesia Preprocedure Evaluation (Signed)
Anesthesia Evaluation  Patient identified by MRN, date of birth, ID band Patient awake    Reviewed: Allergy & Precautions, NPO status , Patient's Chart, lab work & pertinent test results  Airway Mallampati: I  TM Distance: >3 FB Neck ROM: Full    Dental   Pulmonary Current Smoker and Patient abstained from smoking.,    Pulmonary exam normal        Cardiovascular hypertension, Pt. on medications Normal cardiovascular exam     Neuro/Psych Anxiety    GI/Hepatic   Endo/Other    Renal/GU      Musculoskeletal   Abdominal   Peds  Hematology   Anesthesia Other Findings   Reproductive/Obstetrics                             Anesthesia Physical Anesthesia Plan  ASA: II  Anesthesia Plan: General   Post-op Pain Management:    Induction: Intravenous  PONV Risk Score and Plan: 1 and Ondansetron  Airway Management Planned: Oral ETT  Additional Equipment:   Intra-op Plan:   Post-operative Plan: Extubation in OR  Informed Consent: I have reviewed the patients History and Physical, chart, labs and discussed the procedure including the risks, benefits and alternatives for the proposed anesthesia with the patient or authorized representative who has indicated his/her understanding and acceptance.       Plan Discussed with: CRNA and Surgeon  Anesthesia Plan Comments:         Anesthesia Quick Evaluation

## 2019-04-24 NOTE — Op Note (Signed)
04/24/2019  9:20 AM  PATIENT:  Jake Bonilla  33 y.o. 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 Sriansh Farra, MD  ASSISTANT: RN   ANESTHESIA:   General Anorectal & Local field block (0.25% bupivacaine with epinephrine mixed with Liposomal bupivacaine (Experel)    EBL:  Total I/O In: 1000 [I.V.:1000] Out: - .  See anesthesia record  Delay start of Pharmacological VTE agent (>24hrs) due to surgical blood loss or risk of bleeding:  no  DRAINS: none   SPECIMENS:   Right posterior lateral perianal fistulous tract Right perineal cyst at base of right scrotum  DISPOSITION OF SPECIMEN:  PATHOLOGY  COUNTS:  YES  PLAN OF CARE: Discharge to home after PACU  PATIENT DISPOSITION:  PACU - hemodynamically stable.  INDICATION: Patient with probable perirectal fistula.  I recommended examination and surgical treatment:  The anatomy & physiology of the anorectal region was discussed.  We discussed the pathophysiology of anorectal abscess and fistula.  Differential diagnosis was discussed.  Natural history progression was discussed.   I stressed the importance of a bowel regimen to have daily soft bowel movements to minimize progression of disease.     The patient's condition is not adequately controlled.  Non-operative treatment has not healed the fistula.  Therefore, I recommended examination under anaesthesia to confirm the diagnosis and treat the fistula.  I discussed techniques that may be  required such as fistulotomy, ligation by LIFT technique, and/or seton placement.  Benefits & alternatives discussed.  I noted a good likelihood this will help address the problem, but sometimes repeat operations and prolonged healing times may occur.  Risks such as bleeding, pain, recurrence, reoperation, incontinence, heart attack, death, and other risks were discussed.      Educational handouts further explaining the pathology, treatment options, and bowel regimen were given.  The patient expressed understanding & wishes to proceed.  We will work to coordinate surgery for a mutually convenient time.   OR FINDINGS:  Patient had an intersphincteric fistula.  Some tracking along the right anterior aspect with dimpling scar consistent with prior location before.  No active sinus.  Also going towards posterior midline but not quite to the level of the pilonidal region as it was inferior to the coccyx  External location RIGHT POSTERIOR   about 6 cm from anal verge.  Internal location : Posterior midline at anal crypt about 1 cm from anal verge.  20 x 15 mm subcutaneous nodule consistent with chronically inflamed cyst between the right thigh and base of scrotum.  Excised left open.  Some evidence of folliculitis perianally and in the perineum but not classic for hidradenitis.  DESCRIPTION:   Informed consent was confirmed. Patient underwent general anesthesia without difficulty. Patient was placed into prone positioning.  The perianal region was prepped and draped in sterile fashion. Surgical timeout confirmed or plan.  I did digital rectal examination and then transitioned over to anoscopy to get a sense of the anatomy.  I did place a probe through the external opening.   It seemed in the right posterior upper inner buttock and not  consistent with a fistula.  However when I injected the tract with methylene blue, easily had it come out a posterior midline anal crypt.  Some fullness going to the  right anterior aspect with some dimpling consistent with a prior abscess and scarring.  With this I was able to locate an internal opening.  I could probe from the rectal side using the shepherd's crook clearly into the right posterior perirectal region.  The tract seemed to go through the sphincter complex consistent with a intersphincteric fistula.  No abscess located.  I went ahead and proceeded with the LIFT technique.  I began to excise the external opening with a radial biconcave incision around it.  There was some tracking to the right anterior aspect which I freed off .  Track into the posterior midline and freed off as well.  I transitioned to cautery and help free the fistulous tract circumferentially all way down towards the sphincter component.    I made a transverse incision at the posteriorlateral anal squamocolumnar junction .  Did careful dissection to get down to the sphincter complex.  I carefully went between the internal and external sphincter using careful blunt dissection parallel to the fibers.  I was able to locate the intersphincteric component of the fistulous tract.  I was able to get around it gently with a right angle clamp.  I carefully skeletonized the intersphincteric component. I placed 2-0 Vicryl stitches through the intersphincteric tract on the proximal side and on the distal side in between the external & internal sphincters.  I transected the intersphincteric segment of the fistulous tract.   I ligated the stumps of the transected segments with 2-0 Vicryl again with a figure-of-eight stitch in a sagittal fashion to doubly ligate at 90 degrees and prove that the tract had been closed.   I removed the superficial external end of the fistulous tract & ligated the base of the external wound just outside the sphincter component with 2-0 vicryl x 2.  I then transitioned to the rectal component.  Did a figure-of-eight stitch of 2-0 Vicryl suture several centimeters proximal to  the internal opening in the rectum along a hemorrhoidal column since the right posterior hemorrhoid was somewhat enlarged.  I ran that longitudinally until it came to the opening.  I transected the rectal tissue anorectal component and a longitudinal fusiform fashion until I had healthier tissue.  I then ran the 2-0 Vicryl stitch down to the anal verge to also help cover up the intersphincteric ligation wound as well.  I tied that running suture down, thus closing the internal opening and protecting the LIFT repair.  Hemostasis was excellent.  I reexamined the anal canal.   There is was no narrowing.  Hemostasis was excellent.  I repeated anoscopy and examination.  Hemostasis was good.    I then examined his perineum to confirm the subcutaneous nodule between the base of right scrotum and inner thigh.  Excised the disc of this inflamed perineal cyst into that healthy tissue all around and set that separately.  I placed a rolled gauze into the larger perirectal wound.  We placed fluff gauze to onlay over the wounds.  No packing done.  Patient is being extubated go to recovery room.  I discussed operative findings, updated the patient's status, discussed probable steps to recovery, and gave postoperative recommendations to the Patient's ex-wife per his request, Iris.  Recommendations were made.  Questions were answered.  She expressed understanding & appreciation.  Adin Hector, M.D., F.A.C.S. Gastrointestinal and Minimally Invasive Surgery [facility] [address], [place],  [id number] [phone] Main / Paging

## 2019-04-24 NOTE — Discharge Instructions (Signed)
ANORECTAL SURGERY:  POST OPERATIVE INSTRUCTIONS  ######################################################################  EAT Start with a pureed / full liquid diet After 24 hours, gradually transition to a high fiber diet.    CONTROL PAIN Control pain so you can tolerate bowel movements,  walk, sleep, tolerate sneezing/coughing, and go up/down stairs.   HAVE A BOWEL MOVEMENT DAILY Keep your bowels regular to avoid problems.   Taking a fiber supplement every day to keep bowels soft.   Try a laxative to override constipation. Use an antidairrheal to slow down diarrhea.   Call if not better after 2 tries  WALK Walk an hour a day.  Control your pain to do that.   CALL IF YOU HAVE PROBLEMS/CONCERNS Call if you are still struggling despite following these instructions. Call if you have concerns not answered by these instructions  ######################################################################    1. Take your usually prescribed home medications unless otherwise directed.  2. DIET: Follow a light bland diet & liquids the first 24 hours after arrival home, such as soup, liquids, starches, etc.  Be sure to drink plenty of fluids.  Quickly advance to a usual solid diet within a few days.  Avoid fast food or heavy meals as your are more likely to get nauseated or have irregular bowels.  A low-fat, high-fiber diet for the rest of your life is ideal.  3. PAIN CONTROL: a. Pain is best controlled by a usual combination of three different methods TOGETHER: i. Ice/Heat ii. Over the counter pain medication iii. Prescription pain medication b. Expect swelling and discomfort in the anus/rectal area.  Warm water baths (30-60 minutes up to 6 times a day, especially after bowel meovements) will help. Use ice for the first few days to help decrease swelling and bruising, then switch to heat such as warm towels, sitz baths, warm baths, etc to help relax tight/sore spots and speed recovery.   Some people prefer to use ice alone, heat alone, alternating between ice & heat.  Experiment to what works for you.   c. It is helpful to take an over-the-counter pain medication continuously for the first few weeks.  Choose one of the following that works best for you: i. Naproxen (Aleve, etc)  Two 235m tabs twice a day ii. Ibuprofen (Advil, etc) Three 2031mtabs four times a day (every meal & bedtime) iii. Acetaminophen (Tylenol, etc) 500-65083mour times a day (every meal & bedtime) d. A  prescription for pain medication (such as oxycodone, hydrocodone, etc) should be given to you upon discharge.  Take your pain medication as prescribed.  i. If you are having problems/concerns with the prescription medicine (does not control pain, nausea, vomiting, rash, itching, etc), please call us [phone] see if we need to switch you to a different pain medicine that will work better for you and/or control your side effect better. ii. If you need a refill on your pain medication, please contact your pharmacy.  They will contact our office to request authorization. Prescriptions will not be filled after 5 pm or on week-ends.  If can take up to 48 hours for it to be filled & ready so avoid waiting until you are down to thel ast pill. e. A topical cream (Dibucaine) or a prescription for a cream (such as diltiazem 2% gel) may be given to you.  Many people find relief with topical creams.  Some people find it burns too much.  Experiment.  If it helps, use it.  If it burns, don't using  it.  Use a Sitz Bath 4-8 times a day for relief   [employer] A sitz bath is a warm water bath taken in the sitting position that covers only the hips and buttocks. It may be used for either healing or hygiene purposes. Sitz baths are also used to relieve pain, itching, or muscle spasms. The water may contain medicine. Moist heat will help you heal and relax.  HOME CARE INSTRUCTIONS  Take 3 to 4 sitz baths a day. 1. Fill the  bathtub half full with warm water. 2. Sit in the water and open the drain a little. 3. Turn on the warm water to keep the tub half full. Keep the water running constantly. 4. Soak in the water for 15 to 20 minutes. 5. After the sitz bath, pat the affected area dry first.   4. KEEP YOUR BOWELS REGULAR a. The goal is one soft bowel movement a day b. Avoid getting constipated.  Between the surgery and the pain medications, it is common to experience some constipation.  Increasing fluid intake and taking a fiber supplement (such as Metamucil, Citrucel, FiberCon, MiraLax, etc) 2-3 times a day regularly will usually help prevent this problem from occurring.  A mild laxative (prune juice, Milk of Magnesia, MiraLax, etc) should be taken according to package directions if there are no bowel movements after 48 hours. c. Watch out for diarrhea.  If you have many loose bowel movements, simplify your diet to bland foods & liquids for a few days.  Stop any stool softeners and decrease your fiber supplement.  Switching to mild anti-diarrheal medications (Kayopectate, Pepto Bismol) can help.  Can try an imodium/loperamide dose.  If this worsens or does not improve, please call us.  5. Wound Care  a. Remove your bandages with your first bowel movement, usually the day after surgery.  Let the gauze roll packing come out over the weekend in the shower or bath.  Do not repack b. Wear an absorbent pad or gauze in your underwear as needed to catch any drainage and help keep the area  c. Keep the area clean and dry.  Bathe / shower every day.  Keep the area clean by showering / bathing over the incision / wound.   It is okay to soak an open wound to help wash it.  Consider using a squeeze bottle filled with warm water to gently wash the anal area.  Wet wipes or showers / gentle washing after bowel movements is often less traumatic than regular toilet paper. d. Dennis Bast will often notice bleeding with bowel movements.  This  should slow down by the end of the first week of surgery.  Sitting on an ice pack can help. e. Expect some drainage.  This should slow down by the end of the first week of surgery, but you will have occasional bleeding or drainage up to a few months after surgery until the wounds close.  Wear an absorbent pad or soft cotton gauze in your underwear until the drainage stops.  6. ACTIVITIES as tolerated:   a. You may resume regular (light) daily activities beginning the next day--such as daily self-care, walking, climbing stairs--gradually increasing activities as tolerated.  If you can walk 30 minutes without difficulty, it is safe to try more intense activity such as jogging, treadmill, bicycling, low-impact aerobics, swimming, etc. b. Save the most intensive and strenuous activity for last such as sit-ups, heavy lifting, contact sports, etc  Refrain from any heavy lifting or  straining until you are off narcotics for pain control.   c. DO NOT PUSH THROUGH PAIN.  Let pain be your guide: If it hurts to do something, don't do it.  Pain is your body warning you to avoid that activity for another week until the pain goes down. d. You may drive when you are no longer taking prescription pain medication, you can comfortably sit for long periods of time, and you can safely maneuver your car and apply brakes. e. Bonita QuinYou may have sexual intercourse when it is comfortable.  7. FOLLOW UP in our office a. Please call [facility] at [phone] to set up an appointment to see your surgeon in the office for a follow-up appointment approximately 2-3 weeks after your surgery. b. Make sure that you call for this appointment the day you arrive home to ensure a convenient appointment time.  8. IF YOU HAVE DISABILITY OR FAMILY LEAVE FORMS, BRING THEM TO THE OFFICE FOR PROCESSING.  DO NOT GIVE THEM TO YOUR DOCTOR.        WHEN TO CALL US [phone]: 1. Poor pain control 2. Reactions / problems with new medications  (rash/itching, nausea, etc)  3. Fever over 101.5 F (38.5 C) 4. Inability to urinate 5. Nausea and/or vomiting 6. Worsening swelling or bruising 7. Continued bleeding from incision. 8. Increased pain, redness, or drainage from the incision  The clinic staff is available to answer your questions during regular business hours (8:30am-5pm).  Please dont hesitate to call and ask to speak to one of our nurses for clinical concerns.   [facility] is always on call at the hospitals   If you have a medical emergency, go to the nearest emergency room or call 911.    [facility] [address], [address], [place], [place]  [id number] ? MAIN: [phone] ? TOLL FREE: [phone] ? FAX [phone] [email or web address]    Anal Fistula  An anal fistula is a hole that develops between the bowel and the skin near the anus. The anus allows stool (feces) to leave the body. The anus has many tiny glands that make lubricating fluid. Sometimes, these glands become plugged and infected. This can cause a fluid-filled pocket (abscess) to form. An anal fistula often occurs when an abscess becomes infected and then develops into a hole between the bowel and the skin. What are the causes? In most cases, an anal fistula is caused by a past or current buildup of pus around the anus (anal abscess). Other causes include:  A complication of surgery.  Injury to the rectum or the area around it.  Using high-energy beams (radiation) to treat the area around the rectum. What increases the risk? You are more likely to develop this condition if you have certain medical conditions or diseases, including:  Chronic inflammatory bowel disease, such as Crohn's disease or ulcerative colitis.  Colon cancer or rectal cancer.  Diverticular disease, such as diverticulitis.  A sexually transmitted infection, or STI, such as gonorrhea, chlamydia, or  syphilis.  An infection that is caused by HIV. What are the signs or symptoms? Symptoms of this condition include:  Throbbing or constant pain that may be worse while you are sitting.  Swelling or irritation around the anus.  Pus or blood from an opening near the anus.  Pain when passing stool.  Fever or chills. How is this diagnosed? This condition is diagnosed based on:  A physical exam. This may  include: ? An exam to find the external opening of the fistula. ? An exam with a probe or scope to help locate the internal opening of the fistula. ? An exam of the rectum with a gloved hand (digital rectal exam).  Imaging tests that use dye to find the exact location and path of the fistula. Tests may include: ? X-rays. ? Ultrasound. ? CT scan. ? MRI.  Other tests to find the cause of the anal fistula. How is this treated? This condition is most commonly treated with surgery. The type of surgery that is used will depend on where the fistula is located and how complex the fistula is. Surgery may include:  A fistulotomy. The whole fistula is opened up, and the contents are drained to promote healing.  Seton placement. A silk string (seton) is placed into the fistula during a fistulotomy. This helps to drain any infection and promote healing.  Advancement flap procedure. Tissue is removed from your rectum or the skin around the anus and attached to the opening of the fistula.  Bioprosthetic plug. A cone-shaped plug is made from your tissue and is used to block the opening of the fistula. Some anal fistulas do not require surgery. A nonsurgical treatment option involves injecting a fibrin glue to seal the fistula. You also may be prescribed an antibiotic medicine to treat any infection. Follow these instructions at home: Medicines  Take over-the-counter and prescription medicines only as told by your health care provider.  If you were prescribed an antibiotic medicine, take it as  told by your health care provider. Do not stop taking the antibiotic even if you start to feel better.  Use a stool softener or a laxative if told to do so by your health care provider. General instructions   Eat a high-fiber diet as told by your health care provider. This can help to prevent constipation.  Drink enough fluid to keep your urine pale yellow.  Take a warm sitz bath for 15-20 minutes, 3-4 times per day, or as told by your health care provider. Sitz baths can ease your pain and discomfort and help with healing.  Follow good hygiene to keep the anal area as clean and dry as possible. Use wet toilet paper or a moist towelette after each bowel movement.  Keep all follow-up visits as told by your health care provider. This is important. Contact a health care provider if you have:  Increased pain that is not controlled with medicines.  New redness or swelling around the anal area.  New fluid, blood, or pus coming from the anal area.  Tenderness or warmth around the anal area. Get help right away if you have:  A fever.  Severe pain.  Chills or diarrhea.  Severe problems urinating or having a bowel movement. Summary  An anal fistula is a hole that develops between the bowel and the skin near the anus.  This condition is most often caused by a buildup of pus around the anus (anal abscess). Other causes include a complication of surgery, an injury to the rectum, or the use of radiation to treat the rectal area.  This condition is most commonly treated with surgery.  Follow your health care provider's instructions about taking medicines, eating and drinking, or taking sitz baths.  Call your health care provider if you have more pain, swelling, or blood. Get help right away if you have fever, severe pain, or problems passing urine or stool. This information is not intended  to replace advice given to you by your health care provider. Make sure you discuss any questions  you have with your health care provider. Document Released: 08/16/2008 Document Revised: 01/17/2018 Document Reviewed: 01/17/2018 Elsevier Patient Education  2020 Elsevier Inc.    General Anesthesia, Adult, Care After This sheet gives you information about how to care for yourself after your procedure. Your health care provider may also give you more specific instructions. If you have problems or questions, contact your health care provider. What can I expect after the procedure? After the procedure, the following side effects are common:  Pain or discomfort at the IV site.  Nausea.  Vomiting.  Sore throat.  Trouble concentrating.  Feeling cold or chills.  Weak or tired.  Sleepiness and fatigue.  Soreness and body aches. These side effects can affect parts of the body that were not involved in surgery. Follow these instructions at home:  For at least 24 hours after the procedure:  Have a responsible adult stay with you. It is important to have someone help care for you until you are awake and alert.  Rest as needed.  Do not: ? Participate in activities in which you could fall or become injured. ? Drive. ? Use heavy machinery. ? Drink alcohol. ? Take sleeping pills or medicines that cause drowsiness. ? Make important decisions or sign legal documents. ? Take care of children on your own. Eating and drinking  Follow any instructions from your health care provider about eating or drinking restrictions.  When you feel hungry, start by eating small amounts of foods that are soft and easy to digest (bland), such as toast. Gradually return to your regular diet.  Drink enough fluid to keep your urine pale yellow.  If you vomit, rehydrate by drinking water, juice, or clear broth. General instructions  If you have sleep apnea, surgery and certain medicines can increase your risk for breathing problems. Follow instructions from your health care provider about wearing your  sleep device: ? Anytime you are sleeping, including during daytime naps. ? While taking prescription pain medicines, sleeping medicines, or medicines that make you drowsy.  Return to your normal activities as told by your health care provider. Ask your health care provider what activities are safe for you.  Take over-the-counter and prescription medicines only as told by your health care provider.  If you smoke, do not smoke without supervision.  Keep all follow-up visits as told by your health care provider. This is important. Contact a health care provider if:  You have nausea or vomiting that does not get better with medicine.  You cannot eat or drink without vomiting.  You have pain that does not get better with medicine.  You are unable to pass urine.  You develop a skin rash.  You have a fever.  You have redness around your IV site that gets worse. Get help right away if:  You have difficulty breathing.  You have chest pain.  You have blood in your urine or stool, or you vomit blood. Summary  After the procedure, it is common to have a sore throat or nausea. It is also common to feel tired.  Have a responsible adult stay with you for the first 24 hours after general anesthesia. It is important to have someone help care for you until you are awake and alert.  When you feel hungry, start by eating small amounts of foods that are soft and easy to digest (bland), such as toast.  Gradually return to your regular diet.  Drink enough fluid to keep your urine pale yellow.  Return to your normal activities as told by your health care provider. Ask your health care provider what activities are safe for you. This information is not intended to replace advice given to you by your health care provider. Make sure you discuss any questions you have with your health care provider. Document Released: 12/10/2000 Document Revised: 09/06/2017 Document Reviewed: 04/19/2017 Elsevier  Patient Education  2020 [employer]

## 2019-04-24 NOTE — Anesthesia Postprocedure Evaluation (Signed)
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; LYFT REPAIR PERIRECTAL FISTULA (N/A ) REPAIR OF PERIRECTAL FISTULA (N/A )     Patient location during evaluation: PACU Anesthesia Type: General Level of consciousness: awake and alert Pain management: pain level controlled Vital Signs Assessment: post-procedure vital signs reviewed and stable Respiratory status: spontaneous breathing, nonlabored ventilation, respiratory function stable and patient connected to nasal cannula oxygen Cardiovascular status: blood pressure returned to baseline and stable Postop Assessment: no apparent nausea or vomiting Anesthetic complications: no    Last Vitals:  Vitals:   04/24/19 0930 04/24/19 0945  BP: (!) 155/81 (!) 163/88  Pulse: 99 96  Resp: 18   Temp:    SpO2: 100%     Last Pain:  Vitals:   04/24/19 0930  TempSrc:   PainSc: [place]

## 2019-04-24 NOTE — Interval H&P Note (Signed)
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  The various methods of treatment have been discussed with the patient and family. 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  The patient's history has been reviewed, patient examined, no change in status, stable for surgery.  I have reviewed the patient's chart and labs.  Questions were answered to the patient's satisfaction.    I have re-reviewed the the patient's records, history, medications, and allergies.  I have re-examined the patient.  I again discussed intraoperative plans and goals of post-operative recovery.  The patient agrees to proceed.  Jake Bonilla Ligman  1986/08/06 [id number]  Patient Care Team: Wallis BambergMani, Mario, PA-C as PCP - General (Urgent Care)  There are no active problems to display for this patient.   Past Medical History:  Diagnosis Date  . Bronchitis    "AS A YOUNG KID"   . Eczema    LOWER BACK , NECK   . Heart murmur    STATES IN HS , HE WAS SENT TO CARDIOLOGIST FOR A HEART MURMUR  - REPORTS IT IS BENIGN BC HE JOINED THE [employer] RIGHT AFTER SEEING THE HEART DOCTOR   . HTN (hypertension)    REPORTS HIS PCP MANI DX HIM WITH HTN AND THEN STARTED HIM ON BP MEDS BUT HE NO LONGER TAKES  IT AND SAYS HIS PCP IS AWARE   . PTSD (post-traumatic stress disorder)     Past Surgical History:  Procedure Laterality Date  . HERNIA REPAIR  AGE 40   INGUINAL HERNIA     Social History   Socioeconomic History  . Marital status: Divorced    Spouse name: Not on file  . Number of children: Not on file  . Years of education: Not on file  . Highest education level: Not  on file  Occupational History  . Not on file  Social Needs  . Financial resource strain: Not on file  . Food insecurity    Worry: Not on file    Inability: Not on file  . Transportation needs    Medical: Not on file    Non-medical: Not on file  Tobacco Use  . Smoking status: Current Every Day Smoker    Packs/day: 0.50  . Smokeless tobacco: Never Used  . Tobacco comment: "I CUT DOWN TO 3 CIGARETTES A DAY"   Substance and Sexual Activity  . Alcohol use: Never    Frequency: Never  . Drug use: Never  . Sexual activity: Not on file  Lifestyle  . Physical activity    Days per week: Not on file    Minutes per session: Not on file  . Stress: Not on file  Relationships  . Social Musicianconnections    Talks on phone: Not on file    Gets together: Not on file    Attends religious service: Not on file    Active member of club or organization: Not on file    Attends meetings of clubs or organizations: Not on file    Relationship status: Not on file  . Intimate partner violence    Fear of current or ex partner: Not on file    Emotionally abused: Not on file  Physically abused: Not on file    Forced sexual activity: Not on file  Other Topics Concern  . Not on file  Social History Narrative  . Not on file    History reviewed. No pertinent family history.  Medications Prior to Admission  Medication Sig Dispense Refill Last Dose  . acetaminophen (TYLENOL) 500 MG tablet Take 500 mg by mouth every 6 (six) hours as needed for moderate pain.   Past Week at Unknown time  . betamethasone dipropionate (DIPROLENE) 0.05 % cream Apply 1 application topically 2 (two) times daily as needed (eczema).   04/23/2019 at Unknown time  . diphenhydrAMINE (BENADRYL) 25 MG tablet Take 25 mg by mouth every 6 (six) hours as needed for allergies.   Past Week at Unknown time  . hydrOXYzine (VISTARIL) 25 MG capsule Take 25 mg by mouth 3 (three) times daily as needed for itching.   04/19/2019  . ibuprofen (ADVIL) 200  MG tablet Take 200 mg by mouth as needed.   04/21/2019  . triamcinolone ointment (KENALOG) 0.1 % Apply 1 application topically 2 (two) times daily as needed (eczema).   Past Week at Unknown time    Current Facility-Administered Medications  Medication Dose Route Frequency Provider Last Rate Last Dose  . bupivacaine liposome (EXPAREL) 1.3 % injection 266 mg  20 mL Infiltration Once Michael Boston, MD      . cefTRIAXone (ROCEPHIN) 2 g in sodium chloride 0.9 % 100 mL IVPB  2 g Intravenous On Call to OR Michael Boston, MD       And  . metroNIDAZOLE (FLAGYL) IVPB 500 mg  500 mg Intravenous On Call to OR Michael Boston, MD      . Chlorhexidine Gluconate Cloth 2 % PADS 6 each  6 each Topical Once Michael Boston, MD       And  . Chlorhexidine Gluconate Cloth 2 % PADS 6 each  6 each Topical Once Michael Boston, MD      . lactated ringers infusion   Intravenous Continuous Lillia Abed, MD 50 mL/hr at 04/24/19 0610       No Known Allergies  BP (!) 160/78   Pulse 83   Temp 98.4 F (36.9 C) (Oral)   Resp 18   Ht 6' (1.829 m)   Wt 105.7 kg   SpO2 97%   BMI 31.60 kg/m   Labs: No results found for this or any previous visit (from the past 48 hour(s)).  Imaging / Studies: No results found.   Adin Hector, M.D., F.A.C.S. Gastrointestinal and Minimally Invasive Surgery [facility] [address], [place], [place] [address] [phone] Main / Paging  04/24/2019 6:56 AM    Adin Hector

## 2019-04-25 ENCOUNTER — Encounter (HOSPITAL_COMMUNITY): Payer: Self-pay | Admitting: Surgery

## 2019-11-09 ENCOUNTER — Ambulatory Visit: Payer: No Typology Code available for payment source | Admitting: Allergy

## 2019-12-02 ENCOUNTER — Ambulatory Visit: Payer: No Typology Code available for payment source | Admitting: Allergy

## 2019-12-21 ENCOUNTER — Ambulatory Visit: Payer: No Typology Code available for payment source | Admitting: Allergy

## 2019-12-21 NOTE — Progress Notes (Deleted)
New Patient Note  RE: Jake Bonilla MRN: [id number] DOB: [birth year] Date of Office Visit: 12/21/2019  Referring provider: Finis Bud, NP Primary care provider: Jaynee Eagles, PA-C  Chief Complaint: No chief complaint on file.  History of Present Illness: I had the pleasure of seeing [name] for initial evaluation at the [facility] on 12/21/2019. He is a 34 y.o. male, who is referred here by Jaynee Eagles, PA-C for the evaluation of atopic dermatitis.  Rash started about *** ago. Mainly occurs on his ***. Describes them as ***. Individual rashes lasts about ***. No ecchymosis upon resolution. Associated symptoms include: ***. Suspected triggers are ***. Denies any *** fevers, chills, changes in medications, foods, personal care products or recent infections. He has tried the following therapies: *** with *** benefit. Systemic steroids ***. Currently on ***.  Previous work up includes: ***. Previous history of rash/hives: ***. Patient is up to date with the following cancer screening tests: ***.  Assessment and Plan: Luz is a 34 y.o. male with: No problem-specific Assessment & Plan notes found for this encounter.  No follow-ups on file.  No orders of the defined types were placed in this encounter.  Lab Orders  No laboratory test(s) ordered today    Other allergy screening: Asthma: {Blank single:19197::"yes","no"} Rhino conjunctivitis: {Blank single:19197::"yes","no"} Food allergy: {Blank single:19197::"yes","no"} Medication allergy: {Blank single:19197::"yes","no"} Hymenoptera allergy: {Blank single:19197::"yes","no"} Urticaria: {Blank single:19197::"yes","no"} Eczema:{Blank single:19197::"yes","no"} History of recurrent infections suggestive of immunodeficency: {Blank single:19197::"yes","no"}  Diagnostics: Spirometry:  Tracings reviewed. His effort: {Blank single:19197::"Good reproducible efforts.","It was hard to get consistent efforts  and there is a question as to whether this reflects a maximal maneuver.","Poor effort, data can not be interpreted."} FVC: ***L FEV1: ***L, ***% predicted FEV1/FVC ratio: ***% Interpretation: {Blank single:19197::"Spirometry consistent with mild obstructive disease","Spirometry consistent with moderate obstructive disease","Spirometry consistent with severe obstructive disease","Spirometry consistent with possible restrictive disease","Spirometry consistent with mixed obstructive and restrictive disease","Spirometry uninterpretable due to technique","Spirometry consistent with normal pattern","No overt abnormalities noted given today's efforts"}.  Please see scanned spirometry results for details.  Skin Testing: {Blank single:19197::"Select foods","Environmental allergy panel","Environmental allergy panel and select foods","Food allergy panel","None","Deferred due to recent antihistamines use"}. Positive test to: ***. Negative test to: ***.  Results discussed with patient/family.   Past Medical History: There are no problems to display for this patient.  Past Medical History:  Diagnosis Date  . Bronchitis    "AS A YOUNG KID"   . Eczema    LOWER BACK , NECK   . Heart murmur    STATES IN HS , HE WAS SENT TO CARDIOLOGIST FOR A HEART MURMUR  - REPORTS IT IS BENIGN BC HE JOINED THE [employer] RIGHT AFTER SEEING THE HEART DOCTOR   . HTN (hypertension)    REPORTS HIS PCP MANI DX HIM WITH HTN AND THEN STARTED HIM ON BP MEDS BUT HE NO LONGER TAKES  IT AND SAYS HIS PCP IS AWARE   . PTSD (post-traumatic stress disorder)    Past Surgical History: Past Surgical History:  Procedure Laterality Date  . 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 HERNIA REPAIR  AGE 56   INGUINAL HERNIA   . LIGATION OF INTERNAL FISTULA TRACT N/A 04/24/2019    Procedure: REPAIR OF PERIRECTAL FISTULA;  Surgeon: Michael Boston, MD;  Location: [facility];  Service: General;  Laterality: N/A;  . RECTAL EXAM UNDER ANESTHESIA N/A 04/24/2019   Procedure: ANORECTAL EXAM UNDER ANESTHESIA;  Surgeon: Karie Soda, MD;  Location: [facility];  Service: General;  Laterality: N/A;   Medication List:  Current Outpatient Medications  Medication Sig Dispense Refill  . acetaminophen (TYLENOL) 500 MG tablet Take 500 mg by mouth every 6 (six) hours as needed for moderate pain.    Marland Kitchen betamethasone dipropionate (DIPROLENE) 0.05 % cream Apply 1 application topically 2 (two) times daily as needed (eczema).    . diphenhydrAMINE (BENADRYL) 25 MG tablet Take 25 mg by mouth every 6 (six) hours as needed for allergies.    . hydrOXYzine (VISTARIL) 25 MG capsule Take 25 mg by mouth 3 (three) times daily as needed for itching.    Marland Kitchen ibuprofen (ADVIL) 200 MG tablet Take 200 mg by mouth as needed.    Marland Kitchen oxyCODONE (OXY IR/ROXICODONE) 5 MG immediate release tablet Take 1-2 tablets (5-10 mg total) by mouth every 6 (six) hours as needed for moderate pain, severe pain or breakthrough pain. 40 tablet 0  . triamcinolone ointment (KENALOG) 0.1 % Apply 1 application topically 2 (two) times daily as needed (eczema).     No current facility-administered medications for this visit.   Allergies: No Known Allergies Social History: Social History   Socioeconomic History  . Marital status: Divorced    Spouse name: Not on file  . Number of children: Not on file  . Years of education: Not on file  . Highest education level: Not on file  Occupational History  . Not on file  Tobacco Use  . Smoking status: Current Every Day Smoker    Packs/day: 0.50  . Smokeless tobacco: Never Used  . Tobacco comment: "I CUT DOWN TO 3 CIGARETTES A DAY"   Substance and Sexual Activity  . Alcohol use: Never  . Drug use: Never  . Sexual activity: Not on file  Other Topics Concern  . Not on file  Social History  Narrative  . Not on file   Social Determinants of Health   Financial Resource Strain:   . Difficulty of Paying Living Expenses:   Food Insecurity:   . Worried About Programme researcher, broadcasting/film/video in the Last Year:   . Barista in the Last Year:   Transportation Needs:   . Freight forwarder (Medical):   Marland Kitchen Lack of Transportation (Non-Medical):   Physical Activity:   . Days of Exercise per Week:   . Minutes of Exercise per Session:   Stress:   . Feeling of Stress :   Social Connections:   . Frequency of Communication with Friends and Family:   . Frequency of Social Gatherings with Friends and Family:   . Attends Religious Services:   . Active Member of Clubs or Organizations:   . Attends Banker Meetings:   Marland Kitchen Marital Status:    Lives in a ***. Smoking: *** Occupation: ***  Environmental HistorySurveyor, minerals in the house: Copywriter, advertising in the family room: {Blank single:19197::"yes","no"} Carpet in the bedroom: {Blank single:19197::"yes","no"} Heating: {Blank single:19197::"electric","gas"} Cooling: {Blank single:19197::"central","window"} Pet: {Blank single:19197::"yes ***","no"}  Family History: No family history on file. Problem                               Relation Asthma                                   ***  Eczema                                *** Food allergy                          *** Allergic rhino conjunctivitis     ***  Review of Systems  Constitutional: Negative for appetite change, chills, fever and unexpected weight change.  HENT: Negative for congestion and rhinorrhea.   Eyes: Negative for itching.  Respiratory: Negative for cough, chest tightness, shortness of breath and wheezing.   Cardiovascular: Negative for chest pain.  Gastrointestinal: Negative for abdominal pain.  Genitourinary: Negative for difficulty urinating.  Skin: Negative for rash.  Allergic/Immunologic: Negative for environmental  allergies and food allergies.  Neurological: Negative for headaches.   Objective: There were no vitals taken for this visit. There is no height or weight on file to calculate BMI. Physical Exam  Constitutional: He is oriented to person, place, and time. He appears well-developed and well-nourished.  HENT:  Head: Normocephalic and atraumatic.  Right Ear: External ear normal.  Left Ear: External ear normal.  Nose: Nose normal.  Mouth/Throat: Oropharynx is clear and moist.  Eyes: Conjunctivae and EOM are normal.  Cardiovascular: Normal rate, regular rhythm and normal heart sounds. Exam reveals no gallop and no friction rub.  No murmur heard. Pulmonary/Chest: Effort normal and breath sounds normal. He has no wheezes. He has no rales.  Abdominal: Soft.  Musculoskeletal:     Cervical back: Neck supple.  Neurological: He is alert and oriented to person, place, and time.  Skin: Skin is warm. No rash noted.  Psychiatric: He has a normal mood and affect. His behavior is normal.  Nursing note and vitals reviewed.  The plan was reviewed with the patient/family, and all questions/concerned were addressed.  It was my pleasure to see Gyasi today and participate in his care. Please feel free to contact me with any questions or concerns.  Sincerely,  Wyline Mood, DO Allergy & Immunology  [facility] office: [phone] [facility] office: [phone] [place] office: [phone]

## 2020-04-17 DIAGNOSIS — U071 COVID-19: Secondary | ICD-10-CM

## 2020-04-17 HISTORY — DX: COVID-19: U07.1

## 2020-06-24 ENCOUNTER — Other Ambulatory Visit (HOSPITAL_COMMUNITY): Payer: Self-pay | Admitting: Surgery

## 2020-06-24 ENCOUNTER — Other Ambulatory Visit: Payer: Self-pay | Admitting: Surgery

## 2020-06-24 DIAGNOSIS — K603 Anal fistula: Secondary | ICD-10-CM

## 2020-06-28 ENCOUNTER — Ambulatory Visit (HOSPITAL_COMMUNITY)
Admission: RE | Admit: 2020-06-28 | Discharge: 2020-06-28 | Disposition: A | Discharge status: Home / Self Care | Payer: No Typology Code available for payment source | Source: Ambulatory Visit | Attending: Surgery | Admitting: Surgery

## 2020-06-28 ENCOUNTER — Other Ambulatory Visit: Payer: Self-pay

## 2020-06-28 DIAGNOSIS — K603 Anal fistula: Secondary | ICD-10-CM | POA: Diagnosis not present

## 2020-06-28 MED ORDER — GADOBUTROL 1 MMOL/ML IV SOLN
10.0000 mL | Freq: Once | INTRAVENOUS | Status: AC | PRN
  Administered 2020-06-28: 10 mL via INTRAVENOUS

## 2020-10-05 ENCOUNTER — Ambulatory Visit: Payer: Self-pay | Admitting: Surgery

## 2020-10-28 ENCOUNTER — Encounter (HOSPITAL_BASED_OUTPATIENT_CLINIC_OR_DEPARTMENT_OTHER): Payer: Self-pay | Admitting: Surgery

## 2020-10-31 ENCOUNTER — Encounter (HOSPITAL_BASED_OUTPATIENT_CLINIC_OR_DEPARTMENT_OTHER): Payer: Self-pay | Admitting: Surgery

## 2020-10-31 ENCOUNTER — Other Ambulatory Visit (HOSPITAL_COMMUNITY)
Admission: RE | Admit: 2020-10-31 | Discharge: 2020-10-31 | Disposition: A | Discharge status: Home / Self Care | Payer: No Typology Code available for payment source | Source: Ambulatory Visit | Attending: Surgery | Admitting: Surgery

## 2020-10-31 ENCOUNTER — Other Ambulatory Visit: Payer: Self-pay

## 2020-10-31 DIAGNOSIS — Z20822 Contact with and (suspected) exposure to covid-19: Secondary | ICD-10-CM | POA: Diagnosis not present

## 2020-10-31 DIAGNOSIS — Z01812 Encounter for preprocedural laboratory examination: Secondary | ICD-10-CM | POA: Diagnosis present

## 2020-10-31 LAB — SARS CORONAVIRUS 2 (TAT 6-24 HRS): SARS Coronavirus 2: NEGATIVE

## 2020-10-31 NOTE — Progress Notes (Addendum)
Spoke w/ via phone for pre-op interview---pt Lab needs dos----  none            Lab results------none COVID test ------10-31-2020 1015 Arrive at -------930 am  NPO after MN NO Solid Food.  Clear liquids from MN until---800 am then npo Medications to take morning of surgery -----hydroxyazine prn Diabetic medication -----n/a Patient Special Instructions -----follow all instructions in dr gross packet sent Pre-Op special Istructions -----none Patient verbalized understanding of instructions that were given at this phone interview. Patient denies shortness of breath, chest pain, fever, cough at this phone interview.  Pt does not wish to pick up ensure pre surgery drink

## 2020-11-02 ENCOUNTER — Ambulatory Visit (HOSPITAL_BASED_OUTPATIENT_CLINIC_OR_DEPARTMENT_OTHER)
Admission: RE | Admit: 2020-11-02 | Discharge: 2020-11-02 | Disposition: A | Discharge status: Home / Self Care | Payer: No Typology Code available for payment source | Attending: Surgery | Admitting: Surgery

## 2020-11-02 ENCOUNTER — Ambulatory Visit (HOSPITAL_BASED_OUTPATIENT_CLINIC_OR_DEPARTMENT_OTHER): Payer: No Typology Code available for payment source | Admitting: Anesthesiology

## 2020-11-02 ENCOUNTER — Encounter (HOSPITAL_BASED_OUTPATIENT_CLINIC_OR_DEPARTMENT_OTHER)
Admission: RE | Disposition: A | Discharge status: Home / Self Care | Payer: Self-pay | Source: Home / Self Care | Attending: Surgery

## 2020-11-02 ENCOUNTER — Encounter (HOSPITAL_BASED_OUTPATIENT_CLINIC_OR_DEPARTMENT_OTHER): Payer: Self-pay | Admitting: Surgery

## 2020-11-02 DIAGNOSIS — Z8616 Personal history of COVID-19: Secondary | ICD-10-CM | POA: Insufficient documentation

## 2020-11-02 DIAGNOSIS — K6139 Other ischiorectal abscess: Secondary | ICD-10-CM | POA: Insufficient documentation

## 2020-11-02 DIAGNOSIS — F17200 Nicotine dependence, unspecified, uncomplicated: Secondary | ICD-10-CM | POA: Diagnosis not present

## 2020-11-02 HISTORY — DX: Unspecified osteoarthritis, unspecified site: M19.90

## 2020-11-02 HISTORY — DX: Migraine, unspecified, not intractable, without status migrainosus: G43.909

## 2020-11-02 HISTORY — PX: RECTAL EXAM UNDER ANESTHESIA: SHX6399

## 2020-11-02 HISTORY — DX: Rash and other nonspecific skin eruption: R21

## 2020-11-02 HISTORY — PX: PLACEMENT OF SETON: SHX6029

## 2020-11-02 HISTORY — DX: Rectal fistula: K60.4

## 2020-11-02 HISTORY — DX: Presence of spectacles and contact lenses: Z97.3

## 2020-11-02 SURGERY — EXAM UNDER ANESTHESIA, RECTUM
Anesthesia: General | Site: Rectum

## 2020-11-02 MED ORDER — GABAPENTIN 300 MG PO CAPS
ORAL_CAPSULE | ORAL | Status: AC
  Filled 2020-11-02: qty 1

## 2020-11-02 MED ORDER — CELECOXIB 200 MG PO CAPS
ORAL_CAPSULE | ORAL | Status: AC
  Filled 2020-11-02: qty 1

## 2020-11-02 MED ORDER — DEXMEDETOMIDINE (PRECEDEX) IN NS 20 MCG/5ML (4 MCG/ML) IV SYRINGE
PREFILLED_SYRINGE | INTRAVENOUS | Status: DC | PRN
  Administered 2020-11-02: 8 ug via INTRAVENOUS
  Administered 2020-11-02: 12 ug via INTRAVENOUS

## 2020-11-02 MED ORDER — CHLORHEXIDINE GLUCONATE CLOTH 2 % EX PADS: 6.0000 | MEDICATED_PAD | Freq: Once | CUTANEOUS | Status: DC

## 2020-11-02 MED ORDER — PROMETHAZINE HCL 25 MG/ML IJ SOLN
6.2500 mg | INTRAMUSCULAR | Status: DC | PRN
Start: 2020-11-02 — End: 2020-11-02

## 2020-11-02 MED ORDER — PROPOFOL 10 MG/ML IV BOLUS
INTRAVENOUS | Status: DC | PRN
Start: 2020-11-02 — End: 2020-11-02
  Administered 2020-11-02 (×2): 50 mg via INTRAVENOUS
  Administered 2020-11-02: 200 mg via INTRAVENOUS

## 2020-11-02 MED ORDER — BUPIVACAINE LIPOSOME 1.3 % IJ SUSP: 20.0000 mL | Freq: Once | INTRAMUSCULAR | Status: DC

## 2020-11-02 MED ORDER — MIDAZOLAM HCL 2 MG/2ML IJ SOLN
INTRAMUSCULAR | Status: AC
  Filled 2020-11-02: qty 2

## 2020-11-02 MED ORDER — OXYCODONE HCL 5 MG PO TABS: 5.0000 mg | ORAL_TABLET | Freq: Four times a day (QID) | ORAL | 0 refills | Status: AC | PRN

## 2020-11-02 MED ORDER — GLYCOPYRROLATE PF 0.2 MG/ML IJ SOSY
PREFILLED_SYRINGE | INTRAMUSCULAR | Status: AC
  Filled 2020-11-02: qty 1

## 2020-11-02 MED ORDER — SUGAMMADEX SODIUM 500 MG/5ML IV SOLN
INTRAVENOUS | Status: DC | PRN
  Administered 2020-11-02: 300 mg via INTRAVENOUS

## 2020-11-02 MED ORDER — ENSURE PRE-SURGERY PO LIQD: 296.0000 mL | Freq: Once | ORAL | Status: DC

## 2020-11-02 MED ORDER — LIDOCAINE 2% (20 MG/ML) 5 ML SYRINGE
INTRAMUSCULAR | Status: DC | PRN
  Administered 2020-11-02: 100 mg via INTRAVENOUS

## 2020-11-02 MED ORDER — FENTANYL CITRATE (PF) 100 MCG/2ML IJ SOLN
INTRAMUSCULAR | Status: AC
  Filled 2020-11-02: qty 2

## 2020-11-02 MED ORDER — SODIUM CHLORIDE 0.9 % IV SOLN
INTRAVENOUS | Status: AC
  Filled 2020-11-02: qty 100

## 2020-11-02 MED ORDER — METRONIDAZOLE IN NACL 5-0.79 MG/ML-% IV SOLN
INTRAVENOUS | Status: AC
  Filled 2020-11-02: qty 100

## 2020-11-02 MED ORDER — ACETAMINOPHEN 500 MG PO TABS
ORAL_TABLET | ORAL | Status: AC
  Filled 2020-11-02: qty 2

## 2020-11-02 MED ORDER — ROCURONIUM BROMIDE 10 MG/ML (PF) SYRINGE
PREFILLED_SYRINGE | INTRAVENOUS | Status: DC | PRN
  Administered 2020-11-02: 70 mg via INTRAVENOUS

## 2020-11-02 MED ORDER — DEXAMETHASONE SODIUM PHOSPHATE 10 MG/ML IJ SOLN
INTRAMUSCULAR | Status: DC | PRN
  Administered 2020-11-02: 10 mg via INTRAVENOUS

## 2020-11-02 MED ORDER — SODIUM CHLORIDE 0.9 % IV SOLN
2.0000 g | INTRAVENOUS | Status: AC
  Administered 2020-11-02: 2 g via INTRAVENOUS

## 2020-11-02 MED ORDER — LACTATED RINGERS IV SOLN: INTRAVENOUS | Status: DC | PRN

## 2020-11-02 MED ORDER — BUPIVACAINE LIPOSOME 1.3 % IJ SUSP
INTRAMUSCULAR | Status: DC | PRN
  Administered 2020-11-02: 20 mL

## 2020-11-02 MED ORDER — CELECOXIB 200 MG PO CAPS
200.0000 mg | ORAL_CAPSULE | ORAL | Status: AC
  Administered 2020-11-02: 200 mg via ORAL

## 2020-11-02 MED ORDER — ONDANSETRON HCL 4 MG/2ML IJ SOLN
INTRAMUSCULAR | Status: DC | PRN
  Administered 2020-11-02: 4 mg via INTRAVENOUS

## 2020-11-02 MED ORDER — DIAZEPAM 5 MG PO TABS: 5.0000 mg | ORAL_TABLET | Freq: Three times a day (TID) | ORAL | 2 refills | Status: AC | PRN

## 2020-11-02 MED ORDER — DEXMEDETOMIDINE (PRECEDEX) IN NS 20 MCG/5ML (4 MCG/ML) IV SYRINGE
PREFILLED_SYRINGE | INTRAVENOUS | Status: AC
  Filled 2020-11-02: qty 5

## 2020-11-02 MED ORDER — FENTANYL CITRATE (PF) 100 MCG/2ML IJ SOLN
INTRAMUSCULAR | Status: DC | PRN
  Administered 2020-11-02 (×2): 50 ug via INTRAVENOUS

## 2020-11-02 MED ORDER — ACETAMINOPHEN 500 MG PO TABS
1000.0000 mg | ORAL_TABLET | ORAL | Status: AC
  Administered 2020-11-02: 1000 mg via ORAL

## 2020-11-02 MED ORDER — OXYCODONE HCL 5 MG/5ML PO SOLN: 5.0000 mg | Freq: Once | ORAL | Status: AC | PRN

## 2020-11-02 MED ORDER — MIDAZOLAM HCL 2 MG/2ML IJ SOLN
INTRAMUSCULAR | Status: DC | PRN
  Administered 2020-11-02: 2 mg via INTRAVENOUS

## 2020-11-02 MED ORDER — CEFTRIAXONE SODIUM 2 G IJ SOLR
INTRAMUSCULAR | Status: AC
  Filled 2020-11-02: qty 20

## 2020-11-02 MED ORDER — FENTANYL CITRATE (PF) 100 MCG/2ML IJ SOLN: 25.0000 ug | INTRAMUSCULAR | Status: DC | PRN

## 2020-11-02 MED ORDER — OXYCODONE HCL 5 MG PO TABS
5.0000 mg | ORAL_TABLET | Freq: Once | ORAL | Status: AC | PRN
  Administered 2020-11-02: 5 mg via ORAL

## 2020-11-02 MED ORDER — ROCURONIUM BROMIDE 10 MG/ML (PF) SYRINGE
PREFILLED_SYRINGE | INTRAVENOUS | Status: AC
  Filled 2020-11-02: qty 20

## 2020-11-02 MED ORDER — OXYCODONE HCL 5 MG PO TABS
ORAL_TABLET | ORAL | Status: AC
  Filled 2020-11-02: qty 1

## 2020-11-02 MED ORDER — GABAPENTIN 300 MG PO CAPS
300.0000 mg | ORAL_CAPSULE | ORAL | Status: AC
  Administered 2020-11-02: 300 mg via ORAL

## 2020-11-02 MED ORDER — BUPIVACAINE-EPINEPHRINE 0.25% -1:200000 IJ SOLN
INTRAMUSCULAR | Status: DC | PRN
  Administered 2020-11-02: 20 mL

## 2020-11-02 MED ORDER — PHENYLEPHRINE 40 MCG/ML (10ML) SYRINGE FOR IV PUSH (FOR BLOOD PRESSURE SUPPORT)
PREFILLED_SYRINGE | INTRAVENOUS | Status: AC
  Filled 2020-11-02: qty 10

## 2020-11-02 MED ORDER — DIBUCAINE 1 % EX OINT
TOPICAL_OINTMENT | CUTANEOUS | Status: DC | PRN
  Administered 2020-11-02: 1

## 2020-11-02 MED ORDER — METHYLENE BLUE 0.5 % INJ SOLN
INTRAVENOUS | Status: DC | PRN
  Administered 2020-11-02: .8 mL

## 2020-11-02 MED ORDER — SUGAMMADEX SODIUM 500 MG/5ML IV SOLN
INTRAVENOUS | Status: AC
  Filled 2020-11-02: qty 5

## 2020-11-02 MED ORDER — PROPOFOL 10 MG/ML IV BOLUS
INTRAVENOUS | Status: AC
  Filled 2020-11-02: qty 40

## 2020-11-02 MED ORDER — METRONIDAZOLE IN NACL 5-0.79 MG/ML-% IV SOLN
500.0000 mg | INTRAVENOUS | Status: AC
  Administered 2020-11-02: 500 mg via INTRAVENOUS

## 2020-11-02 MED ORDER — SODIUM CHLORIDE 0.9 % IV SOLN: INTRAVENOUS | Status: DC

## 2020-11-02 SURGICAL SUPPLY — 67 items
APL SKNCLS STERI-STRIP NONHPOA (GAUZE/BANDAGES/DRESSINGS) ×1
BENZOIN TINCTURE PRP APPL 2/3 (GAUZE/BANDAGES/DRESSINGS) ×2 IMPLANT
BLADE CLIPPER SENSICLIP SURGIC (BLADE) ×2 IMPLANT
BLADE HEX COATED 2.75 (ELECTRODE) IMPLANT
BLADE SURG 10 STRL SS (BLADE) IMPLANT
BLADE SURG 15 STRL LF DISP TIS (BLADE) ×1 IMPLANT
BLADE SURG 15 STRL SS (BLADE) ×2
BRIEF STRETCH FOR OB PAD LRG (UNDERPADS AND DIAPERS) ×2 IMPLANT
CANISTER SUCT 1200ML W/VALVE (MISCELLANEOUS) IMPLANT
CNTNR URN SCR LID CUP LEK RST (MISCELLANEOUS) ×1 IMPLANT
CONT SPEC 4OZ STRL OR WHT (MISCELLANEOUS) ×2
COVER BACK TABLE 60X90IN (DRAPES) ×2 IMPLANT
COVER WAND RF STERILE (DRAPES) ×2 IMPLANT
DRAPE HYSTEROSCOPY (MISCELLANEOUS) IMPLANT
DRAPE LAPAROTOMY 100X72 PEDS (DRAPES) IMPLANT
DRAPE SHEET LG 3/4 BI-LAMINATE (DRAPES) IMPLANT
DRSG PAD ABDOMINAL 8X10 ST (GAUZE/BANDAGES/DRESSINGS) ×2 IMPLANT
ELECT NEEDLE TIP 2.8 STRL (NEEDLE) IMPLANT
ELECT REM PT RETURN 9FT ADLT (ELECTROSURGICAL) ×2
ELECTRODE REM PT RTRN 9FT ADLT (ELECTROSURGICAL) ×1 IMPLANT
GAUZE 4X4 16PLY RFD (DISPOSABLE) ×2 IMPLANT
GAUZE PACKING IODOFORM 1X5 (PACKING) ×2 IMPLANT
GAUZE SPONGE 4X4 12PLY STRL (GAUZE/BANDAGES/DRESSINGS) IMPLANT
GAUZE SPONGE 4X4 12PLY STRL LF (GAUZE/BANDAGES/DRESSINGS) ×2 IMPLANT
GLOVE ECLIPSE 8.0 STRL XLNG CF (GLOVE) ×2 IMPLANT
GLOVE INDICATOR 8.0 STRL GRN (GLOVE) ×2 IMPLANT
GLOVE SURG ENC MOIS LTX SZ7 (GLOVE) ×2 IMPLANT
GLOVE SURG POLYISO LF SZ5.5 (GLOVE) ×2 IMPLANT
GLOVE SURG POLYISO LF SZ7 (GLOVE) ×2 IMPLANT
GOWN STRL REUS W/TWL LRG LVL3 (GOWN DISPOSABLE) ×2 IMPLANT
GOWN STRL REUS W/TWL XL LVL3 (GOWN DISPOSABLE) ×2 IMPLANT
IV CATH 14GX2 1/4 (CATHETERS) IMPLANT
IV CATH PLACEMENT 20 GA (IV SOLUTION) ×2 IMPLANT
KIT SIGMOIDOSCOPE (SET/KITS/TRAYS/PACK) IMPLANT
KIT TURNOVER CYSTO (KITS) ×2 IMPLANT
LEGGING LITHOTOMY PAIR STRL (DRAPES) IMPLANT
LOOP VESSEL MAXI BLUE (MISCELLANEOUS) IMPLANT
MANIFOLD NEPTUNE II (INSTRUMENTS) ×2 IMPLANT
NEEDLE FILTER BLUNT 18X 1/2SAF (NEEDLE) ×1
NEEDLE FILTER BLUNT 18X1 1/2 (NEEDLE) ×1 IMPLANT
NEEDLE HYPO 22GX1.5 SAFETY (NEEDLE) ×2 IMPLANT
NS IRRIG 500ML POUR BTL (IV SOLUTION) ×2 IMPLANT
PACK BASIN DAY SURGERY FS (CUSTOM PROCEDURE TRAY) ×2 IMPLANT
PAD PREP 24X48 CUFFED NSTRL (MISCELLANEOUS) IMPLANT
PENCIL SMOKE EVACUATOR (MISCELLANEOUS) ×2 IMPLANT
SURGILUBE 2OZ TUBE FLIPTOP (MISCELLANEOUS) IMPLANT
SUT CHROMIC 2 0 SH (SUTURE) ×4 IMPLANT
SUT CHROMIC 3 0 SH 27 (SUTURE) ×2 IMPLANT
SUT ETHIBOND 0 (SUTURE) IMPLANT
SUT MNCRL AB 4-0 PS2 18 (SUTURE) ×2 IMPLANT
SUT PROLENE 2 0 SH DA (SUTURE) IMPLANT
SUT VIC AB 2-0 UR6 27 (SUTURE) ×2 IMPLANT
SUT VIC AB 3-0 SH 18 (SUTURE) IMPLANT
SWAB COLLECTION DEVICE MRSA (MISCELLANEOUS) IMPLANT
SWAB CULTURE ESWAB REG 1ML (MISCELLANEOUS) IMPLANT
SYR 20ML LL LF (SYRINGE) ×2 IMPLANT
SYR 5ML LL (SYRINGE) ×2 IMPLANT
SYR BULB IRRIG 60ML STRL (SYRINGE) IMPLANT
SYR CONTROL 10ML LL (SYRINGE) IMPLANT
SYR TB 1ML LL NO SAFETY (SYRINGE) ×2 IMPLANT
TAPE CLOTH 3X10 TAN LF (GAUZE/BANDAGES/DRESSINGS) ×2 IMPLANT
TOWEL OR 17X26 10 PK STRL BLUE (TOWEL DISPOSABLE) ×2 IMPLANT
TRAY DSU PREP LF (CUSTOM PROCEDURE TRAY) ×2 IMPLANT
TUBE CONNECTING 12X1/4 (SUCTIONS) ×2 IMPLANT
UNDERPAD 30X36 HEAVY ABSORB (UNDERPADS AND DIAPERS) ×2 IMPLANT
WATER STERILE IRR 500ML POUR (IV SOLUTION) IMPLANT
YANKAUER SUCT BULB TIP NO VENT (SUCTIONS) IMPLANT

## 2020-11-02 NOTE — Anesthesia Preprocedure Evaluation (Signed)
Anesthesia Evaluation  Patient identified by MRN, date of birth, ID band Patient awake    Reviewed: Allergy & Precautions, H&P , NPO status , Patient's Chart, lab work & pertinent test results  Airway Mallampati: II  TM Distance: >3 FB Neck ROM: Full    Dental no notable dental hx.    Pulmonary neg pulmonary ROS, Current Smoker and Patient abstained from smoking.,    Pulmonary exam normal breath sounds clear to auscultation       Cardiovascular Normal cardiovascular exam Rhythm:Regular Rate:Normal     Neuro/Psych negative neurological ROS  negative psych ROS   GI/Hepatic negative GI ROS, Neg liver ROS,   Endo/Other  negative endocrine ROS  Renal/GU negative Renal ROS  negative genitourinary   Musculoskeletal negative musculoskeletal ROS (+)   Abdominal   Peds negative pediatric ROS (+)  Hematology negative hematology ROS (+)   Anesthesia Other Findings   Reproductive/Obstetrics negative OB ROS                            Anesthesia Physical Anesthesia Plan  ASA: I  Anesthesia Plan: General   Post-op Pain Management:    Induction: Intravenous  PONV Risk Score and Plan: 1 and Ondansetron, Dexamethasone and Treatment may vary due to age or medical condition  Airway Management Planned: Oral ETT  Additional Equipment:   Intra-op Plan:   Post-operative Plan: Extubation in OR  Informed Consent: I have reviewed the patients History and Physical, chart, labs and discussed the procedure including the risks, benefits and alternatives for the proposed anesthesia with the patient or authorized representative who has indicated his/her understanding and acceptance.     Dental advisory given  Plan Discussed with: CRNA and Surgeon  Anesthesia Plan Comments:         Anesthesia Quick Evaluation

## 2020-11-02 NOTE — Interval H&P Note (Signed)
History and Physical Interval Note:  11/02/2020 10:45 AM  Jake Bonilla  has presented today for surgery, with the diagnosis of RECURRENT PERIRECTAL FISTULA.  The various methods of treatment have been discussed with the patient and family. After consideration of risks, benefits and other options for treatment, the patient has consented to  Procedure(s) with comments: ANORECTAL EXAM UNDER ANESTHESIA (N/A) - GENERAL/LOCAL PLACEMENT OF SETON (N/A) - GENERAL/LOCAL as a surgical intervention.  The patient's history has been reviewed, patient examined, no change in status, stable for surgery.  I have reviewed the patient's chart and labs.  Questions were answered to the patient's satisfaction.    I have re-reviewed the the patient's records, history, medications, and allergies.  I have re-examined the patient.  I again discussed intraoperative plans and goals of post-operative recovery.  The patient agrees to proceed.  Jake Bonilla  01/03/1986 [id number]  Patient Care Team: Patient, No Pcp Per as PCP - General (General Practice) Karie Soda, MD as Consulting Physician (General Surgery) Levi Aland, [place] (Dermatology) Cherlyn Roberts, MD as Referring Physician (Dermatology)  There are no problems to display for this patient.   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   COVID 04/2020   body aches cough x 1 week to 10 days all symptoms resolved   Eczema    LOWER BACK , NECK    Heart murmur in high school   went to cardiologist in high school no murmur found   HTN (hypertension)    no meds ever taken bp normal since 2020   Migraine    Perirectal fistula    PTSD (post-traumatic stress disorder)    PTSD (post-traumatic stress disorder)    Rash    side right hip last few days healing as of 10-31-2020   Wears glasses     Past Surgical History:  Procedure Laterality Date   EXCISION OF KELOID N/A 04/24/2019   Procedure: POSSIBLE EXCISION OF  CHRONIC PERINEAL CYSTS; LYFT REPAIR PERIRECTAL FISTULA;  Surgeon: Karie Soda, MD;  Location: [facility];  Service: General;  Laterality: N/A;   HERNIA REPAIR  AGE 35   INGUINAL HERNIA    LIGATION OF INTERNAL FISTULA TRACT N/A 04/24/2019   Procedure: REPAIR OF PERIRECTAL FISTULA;  Surgeon: Karie Soda, MD;  Location: [facility];  Service: General;  Laterality: N/A;   RECTAL EXAM UNDER ANESTHESIA N/A 04/24/2019   Procedure: ANORECTAL EXAM UNDER ANESTHESIA;  Surgeon: Karie Soda, MD;  Location: [facility];  Service: General;  Laterality: N/A;    Social History   Socioeconomic History   Marital status: Legally Separated    Spouse name: Not on file   Number of children: Not on file   Years of education: Not on file   Highest education level: Not on file  Occupational History   Not on file  Tobacco Use   Smoking status: Current Every Day Smoker    Packs/day: 0.50    Years: 8.00    Pack years: 4.00   Smokeless tobacco: Never Used  Vaping Use   Vaping Use: Never used  Substance and Sexual Activity   Alcohol use: Never   Drug use: Never   Sexual activity: Not on file  Other Topics Concern   Not on file  Social History Narrative   Not on file   Social Determinants of Health   Financial Resource Strain: Not on file  Food Insecurity: Not on file  Transportation Needs: Not on  file  Physical Activity: Not on file  Stress: Not on file  Social Connections: Not on file  Intimate Partner Violence: Not on file    History reviewed. No pertinent family history.  Medications Prior to Admission  Medication Sig Dispense Refill Last Dose   acetaminophen (TYLENOL) 500 MG tablet Take 500 mg by mouth every 6 (six) hours as needed for moderate pain.   11/02/2020 at 0920   hydrOXYzine (VISTARIL) 25 MG capsule Take 25 mg by mouth 3 (three) times daily as needed for itching.   Past Week at Unknown time   ibuprofen (ADVIL) 200 MG tablet Take 200 mg by mouth as needed.   Past Week at Unknown time    triamcinolone ointment (KENALOG) 0.1 % Apply 1 application topically 2 (two) times daily as needed (eczema).   11/01/2020 at Unknown time    Current Facility-Administered Medications  Medication Dose Route Frequency Provider Last Rate Last Admin   0.9 %  sodium chloride infusion   Intravenous Continuous Mellody Dance, MD 50 mL/hr at 11/02/20 0937 New Bag at 11/02/20 0937   bupivacaine liposome (EXPAREL) 1.3 % injection 266 mg  20 mL Infiltration Once Karie Soda, MD       cefTRIAXone (ROCEPHIN) 2 g in sodium chloride 0.9 % 100 mL IVPB  2 g Intravenous On Call to OR Karie Soda, MD       And   metroNIDAZOLE (FLAGYL) IVPB 500 mg  500 mg Intravenous On Call to OR Karie Soda, MD       Chlorhexidine Gluconate Cloth 2 % PADS 6 each  6 each Topical Once Karie Soda, MD       And   Chlorhexidine Gluconate Cloth 2 % PADS 6 each  6 each Topical Once Karie Soda, MD       Melene Muller ON 11/03/2020] feeding supplement (ENSURE PRE-SURGERY) liquid 296 mL  296 mL Oral Once Karie Soda, MD         No Known Allergies  BP 134/75   Pulse 64   Temp 98.5 F (36.9 C) (Oral)   Resp 15   Ht 6' (1.829 m)   Wt 109.3 kg   SpO2 100%   BMI 32.67 kg/m   Labs: No results found for this or any previous visit (from the past 48 hour(s)).  Imaging / Studies: No results found.   Ardeth Sportsman, M.D., F.A.C.S. Gastrointestinal and Minimally Invasive Surgery [facility] [address], [address] [place], [place] [address] [phone] Main / Paging  11/02/2020 10:45 AM     Ardeth Sportsman

## 2020-11-02 NOTE — Transfer of Care (Signed)
Immediate Anesthesia Transfer of Care Note  Patient: Barre Aydelott  Procedure(s) Performed: ANORECTAL EXAM UNDER ANESTHESIA; EXICISION OF CHRONIC ABSCESS CAVITY; MARSUPUALIZATION (N/A Rectum) PLACEMENT OF SETON (N/A Rectum)  Patient Location: PACU  Anesthesia Type:General  Level of Consciousness: awake, alert  and patient cooperative  Airway & Oxygen Therapy: Patient Spontanous Breathing and Patient connected to nasal cannula oxygen  Post-op Assessment: Report given to RN and Post -op Vital signs reviewed and stable  Post vital signs: Reviewed and stable  Last Vitals:  Vitals Value Taken Time  BP 142/80 11/02/20 1308  Temp    Pulse    Resp 17 11/02/20 1312  SpO2    Vitals shown include unvalidated device data.  Last Pain:  Vitals:   11/02/20 0930  TempSrc: Oral  PainSc: 0-No pain      Patients Stated Pain Goal: 2 (11/02/20 0930)  Complications: No complications documented.

## 2020-11-02 NOTE — Op Note (Signed)
11/02/2020  1:01 PM  PATIENT:  Jake Bonilla  35 y.o. male  Patient Care Team: Patient, No Pcp Per as PCP - General (General Practice) Karie Soda, MD as Consulting Physician (General Surgery) Levi Aland, [place] (Dermatology) Cherlyn Roberts, MD as Referring Physician (Dermatology)  PRE-OPERATIVE DIAGNOSIS:  RECURRENT PERIRECTAL FISTULA  POST-OPERATIVE DIAGNOSIS:  CHRONIC ISCHIORECTAL ABSCESS  PROCEDURE:   EXICISION OF CHRONIC ABSCESS CAVITY MARSUPIALIZATION OF WOUND ANORECTAL EXAM UNDER ANESTHESIA  SURGEON:  Ardeth Sportsman, MD  ASSISTANT: OR Staff   ANESTHESIA:   General Anorectal & Local field block (0.25% bupivacaine with epinephrine mixed with Liposomal bupivacaine (Experel)    EBL:  Total I/O In: 1100 [I.V.:1000; IV Piggyback:100] Out: 10 [Blood:10].  See anesthesia record  Delay start of Pharmacological VTE agent (>24hrs) due to surgical blood loss or risk of bleeding:  no  DRAINS: none   SPECIMEN:  Perianal abscess cavity  DISPOSITION OF SPECIMEN:  PATHOLOGY  COUNTS:  YES  PLAN OF CARE: Discharge to home after PACU  PATIENT DISPOSITION:  PACU - hemodynamically stable.  INDICATION: Patient with history of perirectal abscesses recurrent.  Evidence of a intersphincteric fistula undergone prior repair.  Smoker.  Recurrent infections.  Concern for recurrent fistula.  MRI last year showing some scarring raising suspicion for a transenteric fistula.  Recommendation made for examination and surgical treatment:  The anatomy & physiology of the anorectal region was discussed.  We discussed the pathophysiology of anorectal abscess and fistula.  Differential diagnosis was discussed.  Natural history progression was discussed.   I stressed the importance of a bowel regimen to have daily soft bowel movements to minimize progression of disease.     The patient's condition is not adequately controlled.  Non-operative treatment has not healed the fistula.  Therefore, I  recommended examination under anaesthesia to confirm the diagnosis and treat the fistula.  I discussed techniques that may be required such as fistulotomy, ligation by LIFT technique, and/or seton placement.  Benefits & alternatives discussed.  I noted a good likelihood this will help address the problem, but sometimes repeat operations and prolonged healing times may occur.  Risks such as bleeding, pain, recurrence, reoperation, incontinence, heart attack, death, and other risks were discussed.      Educational handouts further explaining the pathology, treatment options, and bowel regimen were given.  The patient expressed understanding & wishes to proceed.  We will work to coordinate surgery for a mutually convenient time.   OR FINDINGS: Patient had a chronic abscess cavity on the right lateral aspect.  Carried more proximally in the ischio rectal space anteriorly.  Cord coming to the sphincters more towards the posterior midline but no open active fistula in the intersphincteric or rectal regions.  No fistula by probe or methylene blue injection.  External location RIGHT LATERAL   about 4 cm from anal verge.  Internal location : NONE  DESCRIPTION:   Informed consent was confirmed. Patient underwent general anesthesia without difficulty. Patient was placed into prone positioning.  The perianal region was prepped and draped in sterile fashion. Surgical timeout confirmed or plan.  I did digital rectal examination and then transitioned over to anoscopy to get a sense of the anatomy.  I did place a probe through the external opening.  He did not seem to carry very far.  He had evidence of right posterior lateral scarring from prior fistula repair and drainages.  I excised the skin around the thickening and scarring through the dermis and got in  the subcutaneous tissues.  Plaisted on axial tension and came around it circumferentially to remove some of the chronic scarring and a few small openings.  I  could see a cord going down to the right posterior sphincter complex which seemed to correlate where the MRI was.  Eventually I was able to find something suspicious for fistulous tract.  I was able to do a partial fistulotomy of the tract in the subcutaneous region.  I could place a probe.  It seemed to go right anterolateral into the ischio rectal space.  Not towards the sphincter complex.   I also injected the track with methylene blue.  This highlighted that chronic cavity.  I injected over a milliliter and massaged.  There was no opening at the anal verge, anal crypts, or proximal rectum.  No staining of methylene blue in the anorectal nor rectal wall.  No evidence of any internal opening fistula.  Procedure excise around the abscess cavity which had been stained with methylene blue.  I did get to the apex that came somewhat close to the right posterior sphincter complex.  However it ended in a blind spot.  There was no persistent opening.  I used 2-0 Vicryl in figure-of-eight fashion sagittally and transversely to help reinforce the most proximal base of this chronic abscess cavity.  I could feel softer tissue between that and the sphincter complex as well, arguing against a persistent opening.  Looked in the rectum and anal canal.  There were some prominent hemorrhoidal tissue and a flat scar consistent with prior lift repair.  However no opening her sinuses.  I did do cautery in a couple prominent anal crypt polyps to help flatten the region out a little bit but did not want to be overly aggressive.  Sphincter complex was intact.  I excised the old scar in the right lateral perirectal region.  Came through the dermis to have an open 5 x 6 cm wound.  I marsupialized the edges with 2-0 chromic interrupted and running suture x3 to help keep the wound from prematurely closing and forming a new abscess or fistula.  Rest of the dermis and subcutaneous tissues were soft and noninflamed.  All methylene blue  stained regions had been excised area against any persistent cavity or tissue remaining.  Again reinspected and saw no evidence recurrent fistula opening of concern.  Therefore I did not do a LIFT repair nor need any seton.  Hemostasis was excellent.  I repeated anoscopy and examination.  Hemostasis was good.  We placed fluff gauze to onlay over the wounds.  Because the chronic abscess cavity did go proximally about 5 cm parallel to the sphincter complex in the right anterior ischio rectal space, I did place a 1 inch iodoform gauze just up into the higher aspect.  No aggressive packing done.  Flap mesh panties done.  Patient is being extubated go to recovery room.  I discussed postoperative care in the holding area the patient.  Instructions are written as well.  I made an attempt to reach his point of contact, Kim.  There was no answer.  We will try and have good follow-up in the office to make sure the external wound heals appropriately.  Ardeth Sportsman, M.D., F.A.C.S. Gastrointestinal and Minimally Invasive Surgery [facility] [address] [place], [place] [address] [phone] Main / Paging

## 2020-11-02 NOTE — Discharge Instructions (Signed)
ANORECTAL SURGERY:  POST OPERATIVE INSTRUCTIONS  ######################################################################   The packing should fall out with first bowel movement. If it does not fall out with first bowel movement, you can remove it in the shower.  EAT Start with a pureed / full liquid diet After 24 hours, gradually transition to a high fiber diet.    CONTROL PAIN Control pain so you can tolerate bowel movements,  walk, sleep, tolerate sneezing/coughing, and go up/down stairs.   HAVE A BOWEL MOVEMENT DAILY Keep your bowels regular to avoid problems.   Taking a fiber supplement every day to keep bowels soft.   Try a laxative to override constipation. Use an antidairrheal to slow down diarrhea.   Call if not better after 2 tries  WALK Walk an hour a day.  Control your pain to do that.   CALL IF YOU HAVE PROBLEMS/CONCERNS Call if you are still struggling despite following these instructions. Call if you have concerns not answered by these instructions  ######################################################################  1. Take your usually prescribed home medications unless otherwise directed.  2. DIET: Follow a light bland diet & liquids the first 24 hours after arrival home, such as soup, liquids, starches, etc.  Be sure to drink plenty of fluids.  Quickly advance to a usual solid diet within a few days.  Avoid fast food or heavy meals as your are more likely to get nauseated or have irregular bowels.  A low-fat, high-fiber diet for the rest of your life is ideal.  3. PAIN CONTROL: a. Pain is best controlled by a usual combination of three different methods TOGETHER: i. Ice/Heat ii. Over the counter pain medication iii. Prescription pain medication b. Expect swelling and discomfort in the anus/rectal area.  Warm water baths (30-60 minutes up to 6 times a day, especially after bowel meovements) will help. Use ice for the first few days to help decrease swelling  and bruising, then switch to heat such as warm towels, sitz baths, warm baths, etc to help relax tight/sore spots and speed recovery.  Some people prefer to use ice alone, heat alone, alternating between ice & heat.  Experiment to what works for you.   c. It is helpful to take an over-the-counter pain medication continuously for the first few weeks.  Choose one of the following that works best for you: i. Naproxen (Aleve, etc)  Two 220mg  tabs twice a day ii. Ibuprofen (Advil, etc) Three 200mg  tabs four times a day (every meal & bedtime) iii. Acetaminophen (Tylenol, etc) 500-650mg  four times a day (every meal & bedtime) d. A  prescription for pain medication (such as oxycodone, hydrocodone, etc) should be given to you upon discharge.  Take your pain medication as prescribed.  i. If you are having problems/concerns with the prescription medicine (does not control pain, nausea, vomiting, rash, itching, etc), please call [phone] to see if we need to switch you to a different pain medicine that will work better for you and/or control your side effect better. ii. If you need a refill on your pain medication, please contact your pharmacy.  They will contact our office to request authorization. Prescriptions will not be filled after 5 pm or on week-ends.  If can take up to 48 hours for it to be filled & ready so avoid waiting until you are down to thel ast pill. e. A topical cream (Dibucaine) or a prescription for a cream (such as diltiazem 2% gel) may be given to you.  Many people find  relief with topical creams.  Some people find it burns too much.  Experiment.  If it helps, use it.  If it burns, don't using it.  Use a Sitz Bath 4-8 times a day for relief  [employer] A sitz bath is a warm water bath taken in the sitting position that covers only the hips and buttocks. It may be used for either healing or hygiene purposes. Sitz baths are also used to relieve pain, itching, or muscle spasms. The water  may contain medicine. Moist heat will help you heal and relax.  HOME CARE INSTRUCTIONS  Take 3 to 4 sitz baths a day. 1. Fill the bathtub half full with warm water. 2. Sit in the water and open the drain a little. 3. Turn on the warm water to keep the tub half full. Keep the water running constantly. 4. Soak in the water for 15 to 20 minutes. 5. After the sitz bath, pat the affected area dry first.   4. KEEP YOUR BOWELS REGULAR a. The goal is one soft bowel movement a day b. Avoid getting constipated.  Between the surgery and the pain medications, it is common to experience some constipation.  Increasing fluid intake and taking a fiber supplement (such as Metamucil, Citrucel, FiberCon, MiraLax, etc) 2-3 times a day regularly will usually help prevent this problem from occurring.  A mild laxative (prune juice, Milk of Magnesia, MiraLax, etc) should be taken according to package directions if there are no bowel movements after 48 hours. c. Watch out for diarrhea.  If you have many loose bowel movements, simplify your diet to bland foods & liquids for a few days.  Stop any stool softeners and decrease your fiber supplement.  Switching to mild anti-diarrheal medications (Kayopectate, Pepto Bismol) can help.  Can try an imodium/loperamide dose.  If this worsens or does not improve, please call us.  5. Wound Care  a. Remove your bandages with your first bowel movement, usually the day after surgery.  Let the gauze fall off with the first bowel movement or shower.   b. Wear an absorbent pad or soft cotton balls in your underwear as needed to catch any drainage and help keep the area  c. Keep the area clean and dry.  Bathe / shower every day.  Keep the area clean by showering / bathing over the incision / wound.   It is okay to soak an open wound to help wash it.  Consider using a squeeze bottle filled with warm water to gently wash the anal area.  Wet wipes or showers / gentle washing after bowel  movements is often less traumatic than regular toilet paper. d. Bonita Quin will often notice bleeding with bowel movements.  This should slow down by the end of the first week of surgery.  Sitting on an ice pack can help. e. Expect some drainage.  This should slow down by the end of the first week of surgery, but you will have occasional bleeding or drainage up to a few months after surgery.  Wear an absorbent pad or soft cotton gauze in your underwear until the drainage stops.  6. ACTIVITIES as tolerated:   a. You may resume regular (light) daily activities beginning the next day--such as daily self-care, walking, climbing stairs--gradually increasing activities as tolerated.  If you can walk 30 minutes without difficulty, it is safe to try more intense activity such as jogging, treadmill, bicycling, low-impact aerobics, swimming, etc. b. Save the most intensive and strenuous  activity for last such as sit-ups, heavy lifting, contact sports, etc  Refrain from any heavy lifting or straining until you are off narcotics for pain control.   c. DO NOT PUSH THROUGH PAIN.  Let pain be your guide: If it hurts to do something, don't do it.  Pain is your body warning you to avoid that activity for another week until the pain goes down. d. You may drive when you are no longer taking prescription pain medication, you can comfortably sit for long periods of time, and you can safely maneuver your car and apply brakes. e. Bonita Quin may have sexual intercourse when it is comfortable.  7. FOLLOW UP in our office a. Please call [facility] at [phone] to set up an appointment to see your surgeon in the office for a follow-up appointment approximately 2-3 weeks after your surgery. b. Make sure that you call for this appointment the day you arrive home to ensure a convenient appointment time.  8. IF YOU HAVE DISABILITY OR FAMILY LEAVE FORMS, BRING THEM TO THE OFFICE FOR PROCESSING.  DO NOT GIVE THEM TO YOUR DOCTOR.  WHEN TO CALL us  [phone]: 1. Poor pain control 2. Reactions / problems with new medications (rash/itching, nausea, etc)  3. Fever over 101.5 F (38.5 C) 4. Inability to urinate 5. Nausea and/or vomiting 6. Worsening swelling or bruising 7. Continued bleeding from incision. 8. Increased pain, redness, or drainage from the incision  The clinic staff is available to answer your questions during regular business hours (8:30am-5pm).  Please dont hesitate to call and ask to speak to one of our nurses for clinical concerns.   A surgeon from [facility] is always on call at the hospitals   If you have a medical emergency, go to the nearest emergency room or call 911.   [facility] [address], [place], [place]  [address] ? MAIN: [phone] ? TOLL FREE: [phone] ? FAX [phone] [email or web address]  Post Anesthesia Home Care Instructions  Activity: Get plenty of rest for the remainder of the day. A responsible individual must stay with you for 24 hours following the procedure.  For the next 24 hours, DO NOT: -Drive a car -Advertising copywriter -Drink alcoholic beverages -Take any medication unless instructed by your physician -Make any legal decisions or sign important papers.  Meals: Start with liquid foods such as gelatin or soup. Progress to regular foods as tolerated. Avoid greasy, spicy, heavy foods. If nausea and/or vomiting occur, drink only clear liquids until the nausea and/or vomiting subsides. Call your physician if vomiting continues.  Special Instructions/Symptoms: Your throat may feel dry or sore from the anesthesia or the breathing tube placed in your throat during surgery. If this causes discomfort, gargle with warm salt water. The discomfort should disappear within 24 hours.  Information for Discharge Teaching: EXPAREL (bupivacaine liposome injectable suspension)   Your surgeon or anesthesiologist gave you  EXPAREL(bupivacaine) to help control your pain after surgery.   EXPAREL is a local anesthetic that provides pain relief by numbing the tissue around the surgical site.  EXPAREL is designed to release pain medication over time and can control pain for up to 72 hours.  Depending on how you respond to EXPAREL, you may require less pain medication during your recovery.  Possible side effects:  Temporary loss of sensation or ability to move in the area where bupivacaine was injected.  Nausea, vomiting, constipation  Rarely, numbness and tingling in  your mouth or lips, lightheadedness, or anxiety may occur.  Call your doctor right away if you think you may be experiencing any of these sensations, or if you have other questions regarding possible side effects.  Follow all other discharge instructions given to you by your surgeon or nurse. Eat a healthy diet and drink plenty of water or other fluids.  If you return to the hospital for any reason within 96 hours following the administration of EXPAREL, it is important for health care providers to know that you have received this anesthetic. A teal colored band has been placed on your arm with the date, time and amount of EXPAREL you have received in order to alert and inform your health care providers. Please leave this armband in place for the full 96 hours following administration, and then you may remove the band.  Anal Fistula  An anal fistula is a hole that develops between the bowel and the skin near the anus. The anus allows stool (feces) to leave the body. The anus has many tiny glands that make lubricating fluid. Sometimes, these glands become plugged and infected. This can cause a fluid-filled pocket (abscess) to form. An anal fistula often occurs when an abscess becomes infected and then develops into a hole between the bowel and the skin. What are the causes? In most cases, an anal fistula is caused by a past or current buildup of pus  around the anus (anal abscess). Other causes include:  A complication of surgery.  Injury to the rectum or the area around it.  Using high-energy beams (radiation) to treat the area around the rectum. What increases the risk? You are more likely to develop this condition if you have certain medical conditions or diseases, including:  Chronic inflammatory bowel disease, such as Crohn's disease or ulcerative colitis.  Colon cancer or rectal cancer.  Diverticular disease, such as diverticulitis.  A sexually transmitted infection, or STI, such as gonorrhea, chlamydia, or syphilis.  An infection that is caused by HIV. What are the signs or symptoms? Symptoms of this condition include:  Throbbing or constant pain that may be worse while you are sitting.  Swelling or irritation around the anus.  Pus or blood from an opening near the anus.  Pain when passing stool.  Fever or chills. How is this diagnosed? This condition is diagnosed based on:  A physical exam. This may include: ? An exam to find the external opening of the fistula. ? An exam with a probe or scope to help locate the internal opening of the fistula. ? An exam of the rectum with a gloved hand (digital rectal exam).  Imaging tests that use dye to find the exact location and path of the fistula. Tests may include: ? X-rays. ? Ultrasound. ? CT scan. ? MRI.  Other tests to find the cause of the anal fistula. How is this treated? This condition is most commonly treated with surgery. The type of surgery that is used will depend on where the fistula is located and how complex the fistula is. Surgery may include:  A fistulotomy. The whole fistula is opened up, and the contents are drained to promote healing.  Seton placement. A silk string (seton) is placed into the fistula during a fistulotomy. This helps to drain any infection and promote healing.  Advancement flap procedure. Tissue is removed from your rectum or  the skin around the anus and attached to the opening of the fistula.  Bioprosthetic plug. A cone-shaped  plug is made from your tissue and is used to block the opening of the fistula. Some anal fistulas do not require surgery. A nonsurgical treatment option involves injecting a fibrin glue to seal the fistula. You also may be prescribed an antibiotic medicine to treat any infection. Follow these instructions at home: Medicines  Take over-the-counter and prescription medicines only as told by your health care provider.  If you were prescribed an antibiotic medicine, take it as told by your health care provider. Do not stop taking the antibiotic even if you start to feel better.  Use a stool softener or a laxative if told to do so by your health care provider. General instructions  Eat a high-fiber diet as told by your health care provider. This can help to prevent constipation.  Drink enough fluid to keep your urine pale yellow.  Take a warm sitz bath for 15-20 minutes, 3-4 times per day, or as told by your health care provider. Sitz baths can ease your pain and discomfort and help with healing.  Follow good hygiene to keep the anal area as clean and dry as possible. Use wet toilet paper or a moist towelette after each bowel movement.  Keep all follow-up visits as told by your health care provider. This is important.   Contact a health care provider if you have:  Increased pain that is not controlled with medicines.  New redness or swelling around the anal area.  New fluid, blood, or pus coming from the anal area.  Tenderness or warmth around the anal area. Get help right away if you have:  A fever.  Severe pain.  Chills or diarrhea.  Severe problems urinating or having a bowel movement. Summary  An anal fistula is a hole that develops between the bowel and the skin near the anus.  This condition is most often caused by a buildup of pus around the anus (anal abscess). Other  causes include a complication of surgery, an injury to the rectum, or the use of radiation to treat the rectal area.  This condition is most commonly treated with surgery.  Follow your health care provider's instructions about taking medicines, eating and drinking, or taking sitz baths.  Call your health care provider if you have more pain, swelling, or blood. Get help right away if you have fever, severe pain, or problems passing urine or stool. This information is not intended to replace advice given to you by your health care provider. Make sure you discuss any questions you have with your health care provider. Document Revised: 01/17/2018 Document Reviewed: 01/17/2018 [employer] Patient Education  2021 [employer]

## 2020-11-02 NOTE — Anesthesia Procedure Notes (Signed)
Procedure Name: Intubation Date/Time: 11/02/2020 11:30 AM Performed by: Suan Halter, CRNA Pre-anesthesia Checklist: Patient identified, Emergency Drugs available, Suction available and Patient being monitored Patient Re-evaluated:Patient Re-evaluated prior to induction Oxygen Delivery Method: Circle system utilized Preoxygenation: Pre-oxygenation with 100% oxygen Induction Type: IV induction Ventilation: Mask ventilation without difficulty Laryngoscope Size: Mac and 4 Grade View: Grade II Tube type: Oral Tube size: 7.5 mm Number of attempts: 1 Airway Equipment and Method: Stylet and Oral airway Placement Confirmation: ETT inserted through vocal cords under direct vision,  positive ETCO2 and breath sounds checked- equal and bilateral Secured at: 23 cm Tube secured with: Tape Dental Injury: Teeth and Oropharynx as per pre-operative assessment  Comments: Upper front tooth chip noted before induction

## 2020-11-02 NOTE — H&P (Signed)
Jake Bonilla Appointment: 06/23/2020 3:45 PM Location: [facility] Patient #: [id number] DOB: [birth year] Separated / Language: [place] / Race: Black or African American Male   History of Present Illness Jake Sportsman MD; 06/23/2020 5:17 PM) The patient is a 35 year old male who presents with a perirectal abscess. 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 Michaell Cowing Status post bedside incision and drainage of right perirectal abscess 01/2020 - Dr. Janee Morn [facility] office s/p incision and drainage of perirectal abscess 03/15/2020 by Hedda Slade [facility] office   The patient returns to clinic after surgery. I last saw him in July and recommended follow-up in 3-4 weeks. It's been 2-1/2 months. Patient noted he seen to be feeling better. Then he had Covid. He started getting over that. Thought his father had lung cancer. Between go within that he & his family quit smoking. He felt everything is healed up and felt well,   but then felt some swelling starting 2 weeks ago. No severe pain or drainage at this point. However he is worried he starting another abscess. He is taking Metamucil moving his bowels once or twice a day. He still sometimes has intermittent bloating and irregular bowels. No major bleeding. No nausea or vomiting. Based concerns he wished to be reevaluated.    ##################################################################  PRIOR SURGERY AUGUST 2020:   Pathology shows benign results:  Diagnosis 1. Fistula, peri-rectal - INFLAMMATION WITH GRANULATION TISSUE AND ABSCESS CONSISTENT WITH PERIRECTAL SINUS. - NO EVIDENCE OF MALIGNANCY. 2. Soft tissue, biopsy, right perineal - SKIN WITH INFLAMMATION AND GRANULATION TISSUE WITH ABSCESS. -  NO EVIDENCE OF MALIGNANCY. ` ` `  04/24/2019  9:20 AM  PATIENT: Jake Bonilla 35 y.o. male  Patient Care Team: Wallis Bamberg, PA-C as PCP - General (Urgent Care) Karie Soda, MD as Consulting Physician (General Surgery) Levi Aland, [place] (Dermatology) Cherlyn Roberts, MD as Referring Physician (Dermatology)  PRE-OPERATIVE DIAGNOSIS: PERINEAL SINUSES X2, POSSIBLE PERIRECTAL FISTULA  POST-OPERATIVE DIAGNOSIS:  PERINEAL CYST INTERSPINTERIC PERIRECTAL FISTULA  PROCEDURE: LIFT REPAIR PERIRECTAL FISTULA EXCISION OF CHRONIC PERINEAL CYST HEMORRHOID LIGATION/PEXY ANORECTAL EXAM UNDER ANESTHESIA   SURGEON: Jake Sportsman, MD  ASSISTANT: RN  ANESTHESIA:  General Anorectal & Local field block (0.25% bupivacaine with epinephrine mixed with Liposomal bupivacaine (Experel)   EBL: Total I/O In: 1000 [I.V.:1000] Out: - . See anesthesia record  Delay start of Pharmacological VTE agent (>24hrs) due to surgical blood loss or risk of bleeding: no  DRAINS: none  SPECIMENS:  Right posterior lateral perianal fistulous tract Right perineal cyst at base of right scrotum  DISPOSITION OF SPECIMEN: PATHOLOGY  COUNTS: YES  PLAN OF CARE: Discharge to home after PACU  PATIENT DISPOSITION: PACU - hemodynamically stable.  INDICATION: Patient with probable perirectal fistula. I recommended examination and surgical treatment:  The anatomy & physiology of the anorectal region was discussed. We discussed the pathophysiology of anorectal abscess and fistula. Differential diagnosis was discussed. Natural history progression was discussed. I stressed the importance of a bowel regimen to have daily soft bowel movements to minimize progression of disease.   The patient's condition is not adequately controlled. Non-operative treatment has not healed the fistula. Therefore, I recommended examination under anaesthesia to confirm the diagnosis and treat the fistula. I  discussed techniques that may be required such as  fistulotomy, ligation by LIFT technique, and/or seton placement. Benefits & alternatives discussed. I noted a good likelihood this will help address the problem, but sometimes repeat operations and prolonged healing times may occur. Risks such as bleeding, pain, recurrence, reoperation, incontinence, heart attack, death, and other risks were discussed.   Educational handouts further explaining the pathology, treatment options, and bowel regimen were given. The patient expressed understanding & wishes to proceed. We will work to coordinate surgery for a mutually convenient time.  OR FINDINGS: Patient had an intersphincteric fistula. Some tracking along the right anterior aspect with dimpling scar consistent with prior location before. No active sinus. Also going towards posterior midline but not quite to the level of the pilonidal region as it was inferior to the coccyx  External location RIGHT POSTERIOR  about 6 cm from anal verge.  Internal location : Posterior midline at anal crypt about 1 cm from anal verge.  20 x 15 mm subcutaneous nodule consistent with chronically inflamed cyst between the right thigh and base of scrotum. Excised left open.  Some evidence of folliculitis perianally and in the perineum but not classic for hidradenitis.  DESCRIPTION:  Informed consent was confirmed. Patient underwent general anesthesia without difficulty. Patient was placed into prone positioning. The perianal region was prepped and draped in sterile fashion. Surgical timeout confirmed or plan.  I did digital rectal examination and then transitioned over to anoscopy to get a sense of the anatomy. I did place a probe through the external opening. It seemed in the right posterior upper inner buttock and not consistent with a fistula. However when I injected the tract with methylene blue, easily had it come out a posterior midline anal crypt.  Some fullness going to the right anterior aspect with some dimpling consistent with a prior abscess and scarring. With this I was able to locate an internal opening. I could probe from the rectal side using the shepherd's crook clearly into the right posterior perirectal region. The tract seemed to go through the sphincter complex consistent with a intersphincteric fistula. No abscess located.  I went ahead and proceeded with the LIFT technique. I began to excise the external opening with a radial biconcave incision around it. There was some tracking to the right anterior aspect which I freed off . Track into the posterior midline and freed off as well. I transitioned to cautery and help free the fistulous tract circumferentially all way down towards the sphincter component.   I made a transverse incision at the posteriorlateral anal squamocolumnar junction . Did careful dissection to get down to the sphincter complex. I carefully went between the internal and external sphincter using careful blunt dissection parallel to the fibers. I was able to locate the intersphincteric component of the fistulous tract. I was able to get around it gently with a right angle clamp. I carefully skeletonized the intersphincteric component. I placed 2-0 Vicryl stitches through the intersphincteric tract on the proximal side and on the distal side in between the external & internal sphincters. I transected the intersphincteric segment of the fistulous tract. I ligated the stumps of the transected segments with 2-0 Vicryl again with a figure-of-eight stitch in a sagittal fashion to doubly ligate at 90 degrees and prove that the tract had been closed. I removed the superficial external end of the fistulous tract & ligated the base of the external wound just outside the sphincter component with 2-0 vicryl x 2.  I then transitioned to the rectal component. Did a  figure-of-eight stitch of 2-0 Vicryl suture  several centimeters proximal to the internal opening in the rectum along a hemorrhoidal column since the right posterior hemorrhoid was somewhat enlarged. I ran that longitudinally until it came to the opening. I transected the rectal tissue anorectal component and a longitudinal fusiform fashion until I had healthier tissue. I then ran the 2-0 Vicryl stitch down to the anal verge to also help cover up the intersphincteric ligation wound as well. I tied that running suture down, thus closing the internal opening and protecting the LIFT repair. Hemostasis was excellent.  I reexamined the anal canal. There is was no narrowing. Hemostasis was excellent. I repeated anoscopy and examination. Hemostasis was good.   I then examined his perineum to confirm the subcutaneous nodule between the base of right scrotum and inner thigh. Excised the disc of this inflamed perineal cyst into that healthy tissue all around and set that separately. I placed a rolled gauze into the larger perirectal wound. We placed fluff gauze to onlay over the wounds. No packing done. Patient is being extubated go to recovery room.  I discussed operative findings, updated the patient's status, discussed probable steps to recovery, and gave postoperative recommendations to the Patient's ex-wife per his request, Iris. Recommendations were made. Questions were answered. She expressed understanding & appreciation.   Jake Bonilla, M.D., F.A.C.S. Gastrointestinal and Minimally Invasive Surgery [facility] 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 hydrOXYzine HCl (  Tablet, Oral) Active. Tylenol (  Capsule, Oral) Active. Medications Reconciled  Social History Jake Sportsman, MD; 06/23/2020 4:06 PM) Caffeine use  Tea. No drug use  Tobacco use  Current some day smoker.  Family History Jake Sportsman, MD; 06/23/2020 4:06 PM) Alcohol Abuse  Father. Diabetes Mellitus  Father, Mother. Hypertension  Father, Mother.  Other Problems Jake Sportsman, MD; 06/23/2020 4:06 PM) Alcohol Abuse  Anxiety Disorder  Arthritis  Back Pain  Depression  High blood pressure  Migraine Headache   Vitals (Chanel Nolan CMA; 06/23/2020 3:44 PM) 06/23/2020 3:44 PM Weight: 234.5 lb Height: 72in Body Surface Area: 2.28 m Body Mass Index: 31.8 kg/m  Temp.: 97.72F  Pulse: 90 (Regular)  BP: 132/76(Sitting, Left Arm, Standard)       Physical Exam Jake Sportsman MD; 06/23/2020 4:25 PM) General Mental Status-Alert. General Appearance-Not in acute distress. Voice-Normal.  Integumentary Global Assessment Upon inspection and palpation of skin surfaces of the - Distribution of scalp and body hair is normal. General Characteristics Overall examination of the patient's skin reveals - no rashes and no suspicious lesions.  Head and Neck Head-normocephalic, atraumatic with no lesions or palpable masses. Face Global Assessment - atraumatic, no absence of expression. Neck Global Assessment - no abnormal movements, no decreased range of  motion. Trachea-midline. Thyroid Gland Characteristics -  non-tender.  Eye Eyeball - Left-Extraocular movements intact, No Nystagmus - Left. Eyeball - Right-Extraocular movements intact, No Nystagmus - Right. Upper Eyelid - Left-No Cyanotic - Left. Upper Eyelid - Right-No Cyanotic - Right.  Chest and Lung Exam Inspection Accessory muscles - No use of accessory muscles in breathing.  Rectal Note: Right posterior perianal diveting scar. No opening.  Right anterior with punctate opening and swelling. Right-sided perianal fullness but no definite abscess. Highly suspicious for chronic fistula. Posterior midline, left lateral and, anterior perirectal region otherwise clear. No fissure. No other external disease. No external hemorrhoids. I held off on digital exam.   Peripheral Vascular Upper Extremity Inspection - Left - Not Gangrenous, No Petechiae. Inspection - Right - Not Gangrenous, No Petechiae.  Neurologic Neurologic evaluation reveals -normal attention span and ability to concentrate, able to name objects and repeat phrases. Appropriate fund of knowledge and normal coordination.  Neuropsychiatric Mental status exam performed with findings of-able to articulate well with normal speech/language, rate, volume and coherence and no evidence of hallucinations, delusions, obsessions or homicidal/suicidal ideation. Orientation-oriented X3.  Musculoskeletal Global Assessment Gait and Station - normal gait and station.  Lymphatic General Lymphatics Description - No Generalized lymphadenopathy.    Assessment & Plan Jake Sportsman MD; 06/23/2020 4:32 PM) Ocie Cornfield ABSCESS (K61.1) Impression: Recurrent perirectal abscess in the setting of prior LIFT anal fistula repair in a smoker  Recurrent swelling and discomfort although no definite drainable abscess. I suspect his get a recurrent fistula in the right side. Right anterior.  I think he needs an MRI of the  pelvis to see if we can help delineate what type of fistulous disease.  Ideally would see gastroneurology for endoscopy to rule out inflammatory bowel disease given a regular bowels and recurrent fistulas. Current Plans Call back in 1 week with progress Restarted Amoxicillin-Pot Clavulanate 875-125 MG Oral Tablet, 1 (one) Tablet twice a day, #14, 7 days starting 06/23/2020, Ref. x2. The anatomy & physiology of the anorectal region was discussed. We discussed the pathophysiology of anorectal abscess and fistula. Differential diagnosis was discussed. Natural history progression was discussed. I stressed the importance of a bowel regimen to have daily soft bowel movements to minimize progression of disease.  The patient's condition is not adequately controlled. Non-operative treatment has not healed the fistula. Therefore, I recommended examination under anaesthesia to confirm the diagnosis and treat the fistula. I discussed techniques that may be required such as fistulotomy, ligation by LIFT technique, and/or seton placement. Benefits & alternatives discussed. I noted a good likelihood this will help address the problem, but sometimes repeat operations and prolonged healing times may occur. Risks such as bleeding, pain, recurrence, reoperation, incontinence, heart attack, death, and other risks were discussed.  Educational handouts further explaining the pathology, treatment options, and bowel regimen were given. The patient expressed understanding & wishes to proceed. We will work to coordinate surgery for a mutually convenient time.  Pt Education - [facility] Abscess/Fistula (AT): discussed with patient and provided information. IRREGULAR BOWEL HABITS (R19.8) Impression: Does have some intermittent diarrhea and constipation and bloating. Given the recurrent fistula I wonder if he has Crohn's. I think it would be wise to refer did gastroenterlogy for evaluation and probable colonoscopy with  ileal and colon random biopsies and rectal biopsies to rule out out. I could order an MRI enterography but I'm skeptical that his insurance will allow that without running through GI first. TOBACCO ABUSE (Z72.0) Impression: STOP SMOKING!  We talked to the patient about the  dangers of smoking. We stressed that tobacco use dramatically increases the risk of peri-operative complications such as infection, tissue necrosis leaving to problems with incision/wound and organ healing, hernia, chronic pain, heart attack, stroke, DVT, pulmonary embolism, and death. We noted there are programs in our community to help stop smoking. Information was available. Current Plans Pt Education - [facility] STOP SMOKING! 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  Rule out Crohn's disease.  Maintain abstinent from smoking.  I suspect she is going to need reoperation with seton placement to get this better and drained it under control. Hemorrhoid pelvis and endoscopies to rule out inflammatory bowel disease or other possible etiologies for recurrence. Them or require a repeat lift repair versus advancement flaps versus conversion to interferent enteric fistula with sphincterotomy. We'll follow closely.  I noted if the Augmentin does not keep things in control & he feels worse, let us know and we would try something emergent exploration and drainage in the OR ENCOUNTER FOR PREOPERATIVE EXAMINATION FOR GENERAL SURGICAL PROCEDURE (Z01.818) Current Plans You are being scheduled for surgery- Our schedulers will call you.  You should hear from our office's scheduling department within 5 working days about the location, date, and time of surgery. We try to make accommodations for patient's preferences in scheduling surgery, but sometimes the OR schedule or the surgeon's schedule prevents [place] from making those accommodations.  If you have not heard from our office [phone]) in 5 working days,  call the office and ask for your surgeon's nurse.  If you have other questions about your diagnosis, plan, or surgery, call the office and ask for your surgeon's nurse.  Pt Education - [facility] Rectal Prep for Anorectal outpatient/office surgery: discussed with patient and provided information. Pt Education - [facility] Rectal Surgery HCI (Minah Axelrod): discussed with patient and provided information. Pt Education - [facility] Good Bowel Health Michaell Cowing)    #############################################  Jake Bonilla Appointment: 10/10/2020 3:15 PM Location: [facility] Patient #: [id number] DOB: 08-Nov-1985 Separated / Language: [place] / Race: Black or African American Male   History of Present Illness Tsosie Billing PA C; 10/11/2020 8:54 AM) The patient is a 35 year old male who presents with a perirectal abscess. 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 Michaell Cowing Status post bedside incision and drainage of right perirectal abscess 01/2020 - Dr. Janee Morn [facility] office s/p incision and drainage of perirectal abscess 03/15/2020 by Hedda Slade [facility] office  Previous H&P Dr. Michaell Cowing Oct 2021 The patient returns to clinic after surgery. I last saw him in July and recommended follow-up in 3-4 weeks. It's been 2-1/2 months. Patient noted he seen to be feeling better. That he had Covid. He started getting over that. Thought his father had lung cancer. Between go within that he is family quit smoking. He felt everything is healed up and felt well, but then felt some swelling starting 2 weeks ago. No severe pain or drainage at this point. However he is worried he starting another abscess. He is taking Metamucil moving his bowels once or twice a day. He still sometimes has intermittent bloating  and irregular bowels. No major bleeding. No nausea or vomiting. Based concerns he wished to be reevaluated.  *Recheck 10/10/20 He presented to the office last week with recurrent right-sided perianal pain.  I attempted bedside I&D, but did not encounter a large or deep abscess cavity. He states 2 days later the area drained a large amount relieve most of his pain. He only complains of discomfort now. He states drainage has slowed down. Denies fevers. He was supposed follow-up with GI to have a colonoscopy to rule out Crohn's disease but has not done this yet. MRI on 06/28/20 confirmed fistula.    ##################################################################  PRIOR SURGERY AUGUST 2020:   Pathology shows benign results:  Diagnosis 1. Fistula, peri-rectal - INFLAMMATION WITH GRANULATION TISSUE AND ABSCESS CONSISTENT WITH PERIRECTAL SINUS. - NO EVIDENCE OF MALIGNANCY. 2. Soft tissue, biopsy, right perineal - SKIN WITH INFLAMMATION AND GRANULATION TISSUE WITH ABSCESS. - NO EVIDENCE OF MALIGNANCY. ` ` `  04/24/2019  9:20 AM  PATIENT: Jake Bonilla 35 y.o. male  Patient Care Team: Wallis Bamberg, PA-C as PCP - General (Urgent Care) Karie Soda, MD as Consulting Physician (General Surgery) Levi Aland, [place] (Dermatology) Cherlyn Roberts, MD as Referring Physician (Dermatology)  PRE-OPERATIVE DIAGNOSIS: PERINEAL SINUSES X2, POSSIBLE PERIRECTAL FISTULA  POST-OPERATIVE DIAGNOSIS:  PERINEAL CYST INTERSPINTERIC PERIRECTAL FISTULA  PROCEDURE: LIFT REPAIR PERIRECTAL FISTULA EXCISION OF CHRONIC PERINEAL CYST HEMORRHOID LIGATION/PEXY ANORECTAL EXAM UNDER ANESTHESIA   SURGEON: Jake Sportsman, MD  ASSISTANT: RN  ANESTHESIA:  General Anorectal & Local field block (0.25% bupivacaine with epinephrine mixed with Liposomal bupivacaine (Experel)   EBL: Total I/O In: 1000 [I.V.:1000] Out: - . See anesthesia record  Delay start of Pharmacological VTE  agent (>24hrs) due to surgical blood loss or risk of bleeding: no  DRAINS: none  SPECIMENS:  Right posterior lateral perianal fistulous tract Right perineal cyst at base of right scrotum  DISPOSITION OF SPECIMEN: PATHOLOGY  COUNTS: YES  PLAN OF CARE: Discharge to home after PACU  PATIENT DISPOSITION: PACU - hemodynamically stable.  INDICATION: Patient with probable perirectal fistula. I recommended examination and surgical treatment:  The anatomy & physiology of the anorectal region was discussed. We discussed the pathophysiology of anorectal abscess and fistula. Differential diagnosis was discussed. Natural history progression was discussed. I stressed the importance of a bowel regimen to have daily soft bowel movements to minimize progression of disease.   The patient's condition is not adequately controlled. Non-operative treatment has not healed the fistula. Therefore, I recommended examination under anaesthesia to confirm the diagnosis and treat the fistula. I discussed techniques that may be required such as fistulotomy, ligation by LIFT technique, and/or seton placement. Benefits & alternatives discussed. I noted a good likelihood this will help address the problem, but sometimes repeat operations and prolonged healing times may occur. Risks such as bleeding, pain, recurrence, reoperation, incontinence, heart attack, death, and other risks were discussed.   Educational handouts further explaining the pathology, treatment options, and bowel regimen were given. The patient expressed understanding & wishes to proceed. We will work to coordinate surgery for a mutually convenient time.  OR FINDINGS: Patient had an intersphincteric fistula. Some tracking along the right anterior aspect with dimpling scar consistent with prior location before. No active sinus. Also going towards posterior midline but not quite to the level of the pilonidal region as it was inferior  to the coccyx  External location RIGHT POSTERIOR  about 6 cm from anal verge.  Internal location : Posterior midline at anal crypt about 1 cm from anal verge.  20 x 15 mm subcutaneous nodule consistent with chronically inflamed cyst between the right thigh and base of scrotum. Excised left open.  Some evidence of  folliculitis perianally and in the perineum but not classic for hidradenitis.  DESCRIPTION:  Informed consent was confirmed. Patient underwent general anesthesia without difficulty. Patient was placed into prone positioning. The perianal region was prepped and draped in sterile fashion. Surgical timeout confirmed or plan.  I did digital rectal examination and then transitioned over to anoscopy to get a sense of the anatomy. I did place a probe through the external opening. It seemed in the right posterior upper inner buttock and not consistent with a fistula. However when I injected the tract with methylene blue, easily had it come out a posterior midline anal crypt. Some fullness going to the right anterior aspect with some dimpling consistent with a prior abscess and scarring. With this I was able to locate an internal opening. I could probe from the rectal side using the shepherd's crook clearly into the right posterior perirectal region. The tract seemed to go through the sphincter complex consistent with a intersphincteric fistula. No abscess located.  I went ahead and proceeded with the LIFT technique. I began to excise the external opening with a radial biconcave incision around it. There was some tracking to the right anterior aspect which I freed off . Track into the posterior midline and freed off as well. I transitioned to cautery and help free the fistulous tract circumferentially all way down towards the sphincter component.   I made a transverse incision at the posteriorlateral anal squamocolumnar junction . Did careful dissection to get down to the  sphincter complex. I carefully went between the internal and external sphincter using careful blunt dissection parallel to the fibers. I was able to locate the intersphincteric component of the fistulous tract. I was able to get around it gently with a right angle clamp. I carefully skeletonized the intersphincteric component. I placed 2-0 Vicryl stitches through the intersphincteric tract on the proximal side and on the distal side in between the external & internal sphincters. I transected the intersphincteric segment of the fistulous tract. I ligated the stumps of the transected segments with 2-0 Vicryl again with a figure-of-eight stitch in a sagittal fashion to doubly ligate at 90 degrees and prove that the tract had been closed. I removed the superficial external end of the fistulous tract & ligated the base of the external wound just outside the sphincter component with 2-0 vicryl x 2.  I then transitioned to the rectal component. Did a figure-of-eight stitch of 2-0 Vicryl suture several centimeters proximal to the internal opening in the rectum along a hemorrhoidal column since the right posterior hemorrhoid was somewhat enlarged. I ran that longitudinally until it came to the opening. I transected the rectal tissue anorectal component and a longitudinal fusiform fashion until I had healthier tissue. I then ran the 2-0 Vicryl stitch down to the anal verge to also help cover up the intersphincteric ligation wound as well. I tied that running suture down, thus closing the internal opening and protecting the LIFT repair. Hemostasis was excellent.  I reexamined the anal canal. There is was no narrowing. Hemostasis was excellent. I repeated anoscopy and examination. Hemostasis was good.   I then examined his perineum to confirm the subcutaneous nodule between the base of right scrotum and inner thigh. Excised the disc of this inflamed perineal cyst into that healthy tissue all around  and set that separately. I placed a rolled gauze into the larger perirectal wound. We placed fluff gauze to onlay over the wounds. No packing done. Patient is being extubated  go to recovery room.  I discussed operative findings, updated the patient's status, discussed probable steps to recovery, and gave postoperative recommendations to the Patient's ex-wife per his request, Iris. Recommendations were made. Questions were answered. She expressed understanding & appreciation.   Jake Bonilla, M.D., F.A.C.S. Gastrointestinal and Minimally Invasive Surgery [facility] [address], [address] [place], [place] [address] [phone] Main / Paging   Allergies Micheal Likens, [place]; 10/10/2020 3:17 PM) No Known Drug Allergies  [04/14/2019]: Allergies Reconciled   Medication History (Chanel Lonni Fix, CMA; 10/10/2020 3:17 PM) Amoxicillin-Pot Clavulanate (875-125MG  Tablet, 1 (one) Oral twice a day, Taken starting 10/05/2020) Active. HYDROcodone-Acetaminophen (5-325MG  Tablet, 1 (one) Oral 1 every 4-6 hours for breakthrough pain, after taking Tylenol and ibuprofen, Taken starting 10/05/2020) Active. Ibuprofen (200MG  Tablet, Oral) Active. hydrOXYzine HCl (25MG  Tablet, Oral) Active. Tylenol (500MG  Capsule, Oral) Active. Medications Reconciled    Review of Systems PA C; 10/11/2020 8:54 AM) General Present- Night Sweats and Weight Gain. Not Present- Appetite Loss, Chills, Fatigue, Fever and Weight Loss. Skin Present- Hives and Rash. Not Present- Change in Wart/Mole, Dryness, Jaundice, New Lesions, Non-Healing Wounds and Ulcer. HEENT Present- Seasonal Allergies and Wears glasses/contact lenses. Not Present- Earache, Hearing Loss, Hoarseness, Nose Bleed, Oral Ulcers, Ringing in the Ears, Sinus Pain, Sore Throat, Visual Disturbances and Yellow Eyes. Respiratory Present- Snoring. Not Present- Bloody sputum, Chronic Cough, Difficulty Breathing and  Wheezing. Breast Not Present- Breast Mass, Breast Pain, Nipple Discharge and Skin Changes. Cardiovascular Not Present- Chest Pain, Difficulty Breathing Lying Down, Leg Cramps, Palpitations, Rapid Heart Rate, Shortness of Breath and Swelling of Extremities. Gastrointestinal Not Present- Abdominal Pain, Bloating, Bloody Stool, Change in Bowel Habits, Chronic diarrhea, Constipation, Difficulty Swallowing, Excessive gas, Gets full quickly at meals, Hemorrhoids, Indigestion, Nausea, Rectal Pain and Vomiting. Male Genitourinary Not Present- Blood in Urine, Change in Urinary Stream, Frequency, Impotence, Nocturia, Painful Urination, Urgency and Urine Leakage. Musculoskeletal Not Present- Back Pain, Joint Pain, Joint Stiffness, Muscle Pain, Muscle Weakness and Swelling of Extremities. Neurological Not Present- Decreased Memory, Fainting, Headaches, Numbness, Seizures, Tingling, Tremor, Trouble walking and Weakness. Psychiatric Present- Anxiety. Not Present- Bipolar, Change in Sleep Pattern, Depression, Fearful and Frequent crying. Endocrine Not Present- Cold Intolerance, Excessive Hunger, Hair Changes, Heat Intolerance and New Diabetes. Hematology Not Present- Blood Thinners, Easy Bruising, Excessive bleeding, Gland problems, HIV and Persistent Infections.   Physical Exam PA C; 10/11/2020 8:57 AM) Chest and Lung Exam Note: CTA   Cardiovascular Note: RRR   Abdomen Note: soft, NT   Rectal Note: Perianal exam reveals a firm area on the right posterior perianal area near I&D site. There is no obvious fluctuance or swelling. 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 It sounds like abscess cavity spontaneously drained a few days later. He is no longer having much pain. There is no active drainage,  swelling, or fluctuance. He will continue his antibiotics. Surgery orders have been completed by Dr 10/13/2020. He should be hearing from our office to schedule surgery. He is aware that most likely a seton will be placed, and it will not be removed until he follows up with GI, and agrees with this plan. Current Plans Pt Education - Anal Fistula: anal fistula Please follow up in the office with the surgeon that performed your operation  for continued postoperative management Signed by Tsosie BillingPuja G Gosai, PA C (10/11/2020 8:58 AM)  Co-signed by Jake SportsmanSteven C Lafawn Lenoir MD (10/11/2020 9:05 AM)   ######################################################  MRI suspicious for internal opening and possible transient tear versus intersphincteric fistula.  Recommend examination anesthesia.  I tried to leave the LIFT repair versus seton placement.  Because of this recurrent infection he is willing to proceed with surgery  CLINICAL DATA:  Right anterior perirectal/perineal scar suspicious for chronic perirectal fistula versus sebaceous cyst.  EXAM: MRI PELVIS WITHOUT AND WITH CONTRAST  TECHNIQUE: Multiplanar multisequence MR imaging of the pelvis was performed both before and after administration of intravenous contrast.  CONTRAST:  10mL GADAVIST GADOBUTROL 1 MMOL/ML IV SOLN  COMPARISON:  None.  FINDINGS: Urinary Tract:  Normal urinary bladder.  Bowel: There is no evidence of proctitis. 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 Example 29/10, 20 and 21/9, 28/6. This results in edema within the right ischioanal fossa without abscess. Example 39/6 and 37/10.  Vascular/Lymphatic: No imaged pelvic adenopathy or sidewall aneurysm.  Reproductive:  Normal prostate.  Other:  Trace fluid within the right pelvis on 18/6.  Musculoskeletal: No acute osseous abnormality.  IMPRESSION: 1. 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 No complicating abscess. 2. Trace right pelvic fluid, possibly secondary.   Electronically Signed   By: Jeronimo GreavesKyle  Talbot M.D.   On: 06/29/2020 08:48

## 2020-11-02 NOTE — Anesthesia Postprocedure Evaluation (Signed)
Anesthesia Post Note  Patient: Jake Bonilla  Procedure(s) Performed: ANORECTAL EXAM UNDER ANESTHESIA; EXICISION OF CHRONIC ABSCESS CAVITY; MARSUPUALIZATION (N/A Rectum) PLACEMENT OF SETON (N/A Rectum)     Patient location during evaluation: PACU Anesthesia Type: General Level of consciousness: awake and alert Pain management: pain level controlled Vital Signs Assessment: post-procedure vital signs reviewed and stable Respiratory status: spontaneous breathing, nonlabored ventilation, respiratory function stable and patient connected to nasal cannula oxygen Cardiovascular status: blood pressure returned to baseline and stable Postop Assessment: no apparent nausea or vomiting Anesthetic complications: no   No complications documented.  Last Vitals:  Vitals:   11/02/20 1309 11/02/20 1315  BP: (!) 142/80 140/74  Pulse: 90   Resp:  19  Temp: (!) 36.4 C   SpO2: 99% 98%    Last Pain:  Vitals:   11/02/20 1309  TempSrc:   PainSc: 0-No pain                 Keyon Winnick S

## 2020-11-03 ENCOUNTER — Encounter (HOSPITAL_BASED_OUTPATIENT_CLINIC_OR_DEPARTMENT_OTHER): Payer: Self-pay | Admitting: Surgery

## 2020-11-03 LAB — SURGICAL PATHOLOGY

## 2022-03-28 IMAGING — MR MR PELVIS WO/W CM
4 of 8 series · 19 of 48 positions shown · IV contrast (gadavist)
Comparison: None.

CLINICAL DATA: Right anterior perirectal/perineal scar suspicious
for chronic perirectal fistula versus sebaceous cyst.

EXAM:
MRI PELVIS WITHOUT AND WITH CONTRAST
TECHNIQUE: Multiplanar multisequence MR imaging of the pelvis was performed
both before and after administration of intravenous contrast.
CONTRAST:  10mL GADAVIST GADOBUTROL 1 MMOL/ML IV SOLN

[Series 3: T2 · sagittal · 2.5mm · 0.53mm/px · 8 of 70 slices shown (1 of 2)]
[im 1/70]
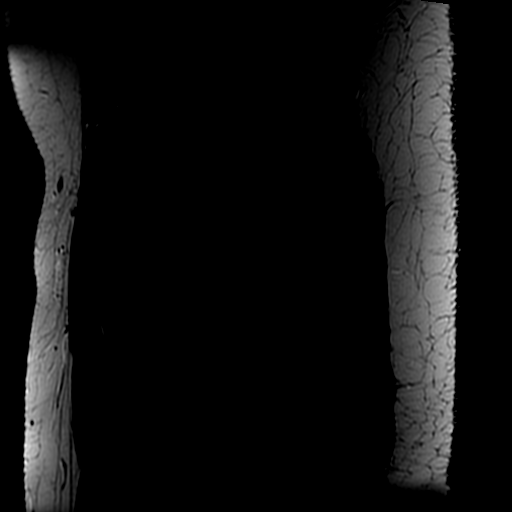
[im 10/70]
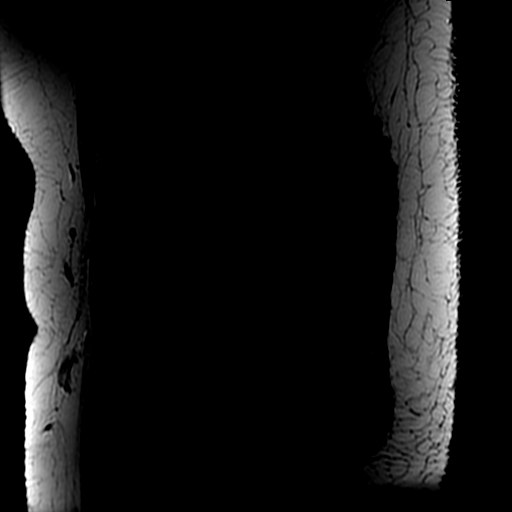
[im 20/70]
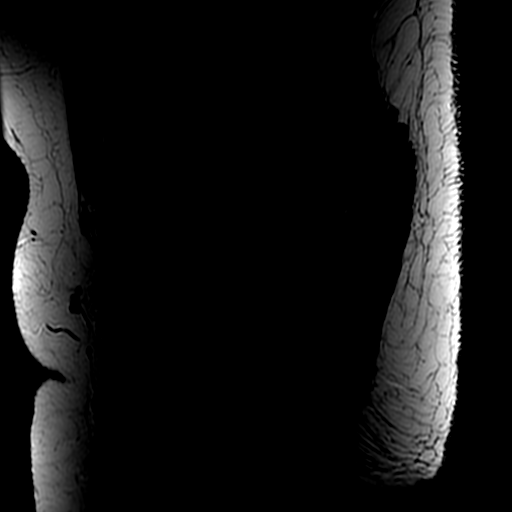
[im 30/70]
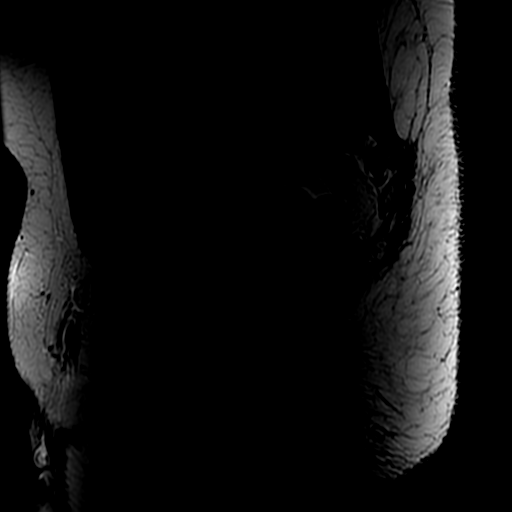
[im 40/70]
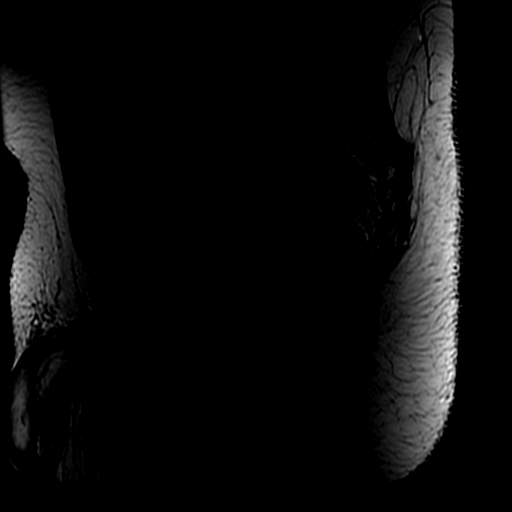
[im 50/70]
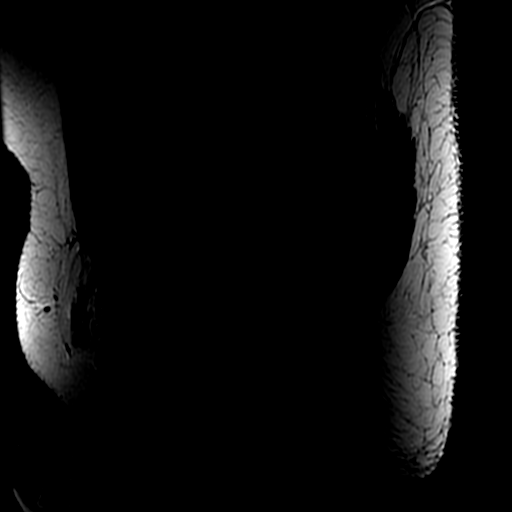
[im 60/70]
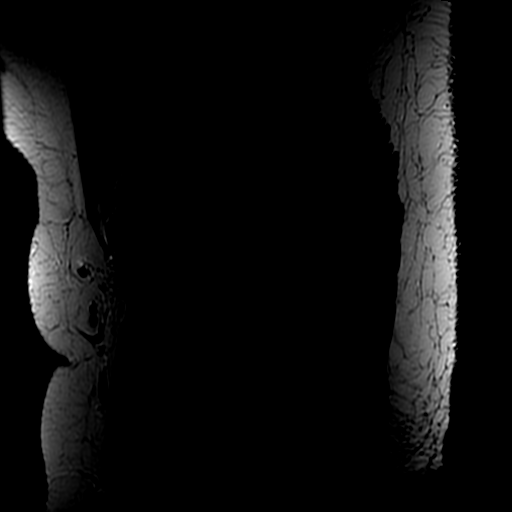
[im 70/70]
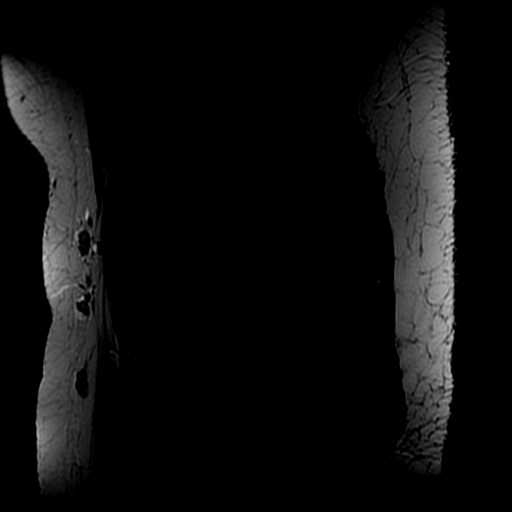

[Series 4: T2 fat-sat · sagittal · 2.5mm · 0.53mm/px · 5 of 70 slices shown (1 of 2)]
[im 1/70]
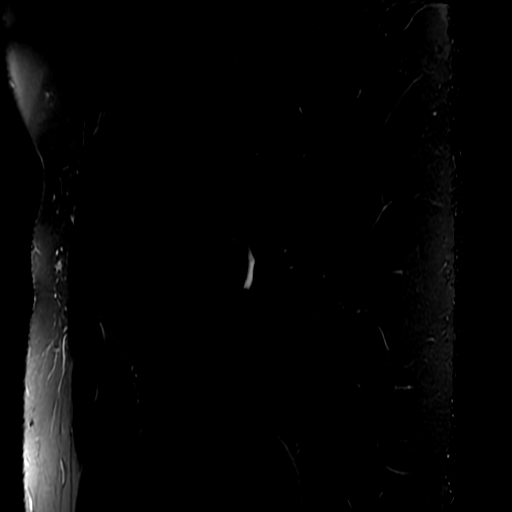
[im 10/70]
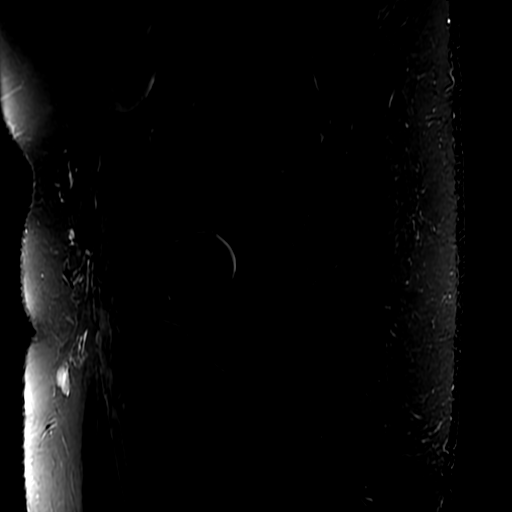
[im 20/70]
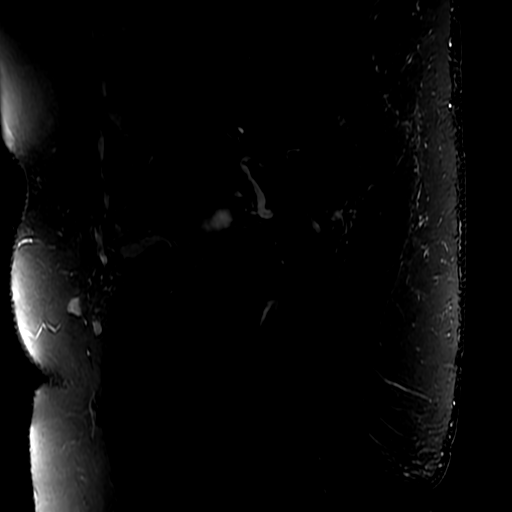
[im 40/70]
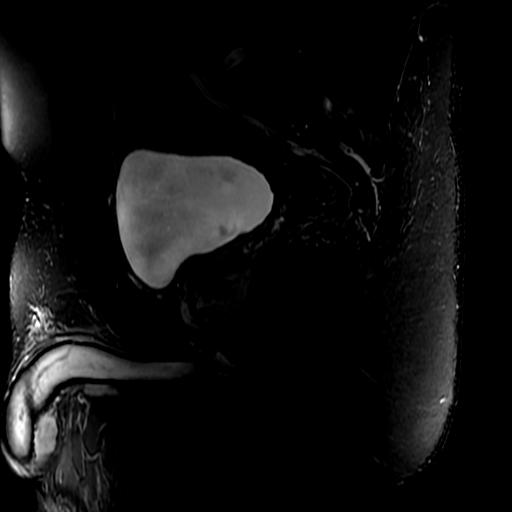
[im 60/70]
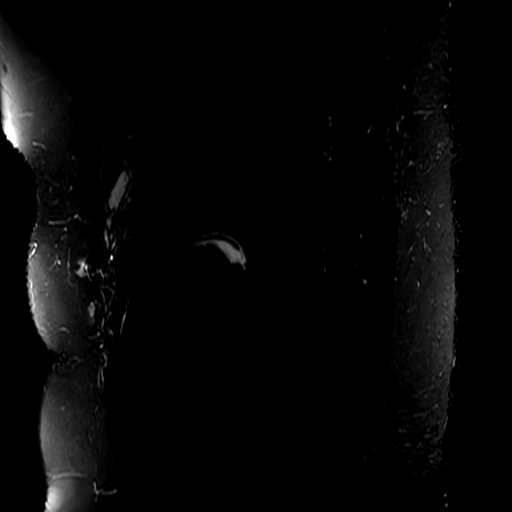

[Series 5: T2 · axial · 4.0mm · 0.47mm/px · z∈[-33,+140]mm · 3 of 53 slices shown (2 of 2)]
[im 11/53]
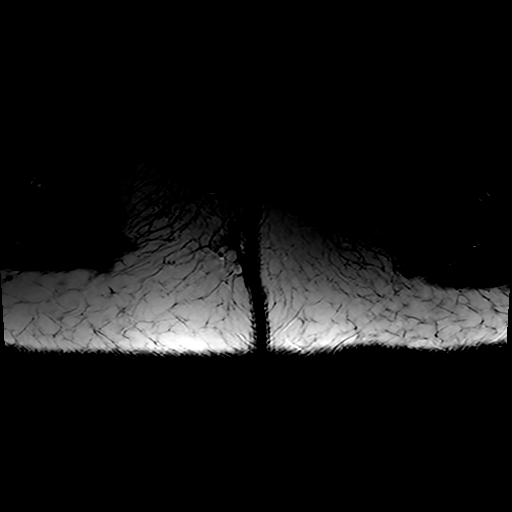
[im 32/53]
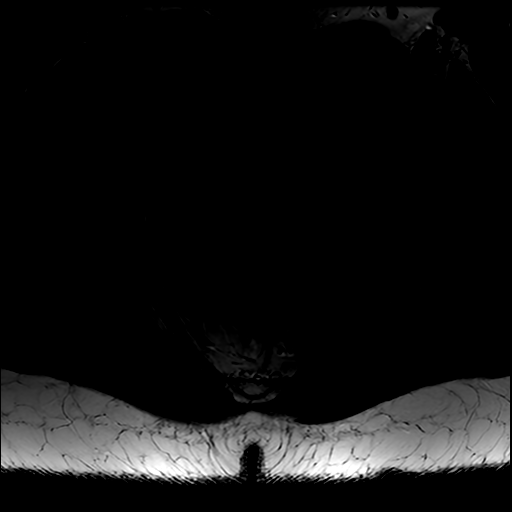
[im 53/53]
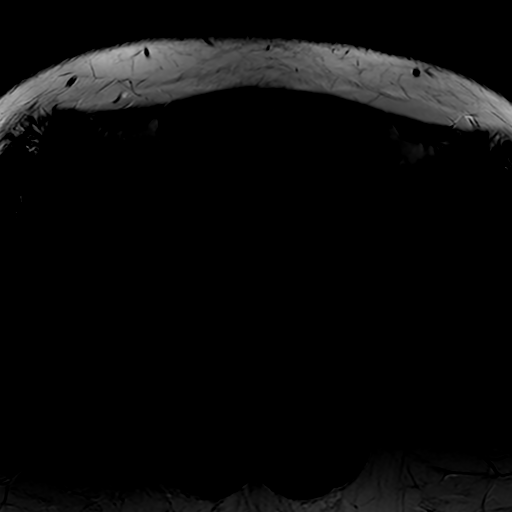

[Series 6: T2 fat-sat · axial · 4.0mm · 0.47mm/px · z∈[-33,+140]mm · 3 of 53 slices shown (2 of 2)]
[im 11/53]
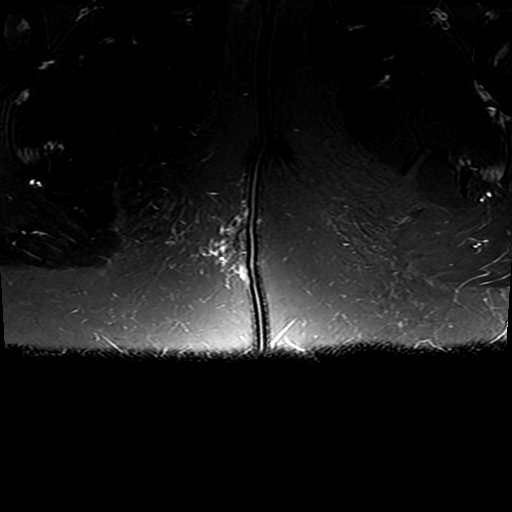
[im 32/53]
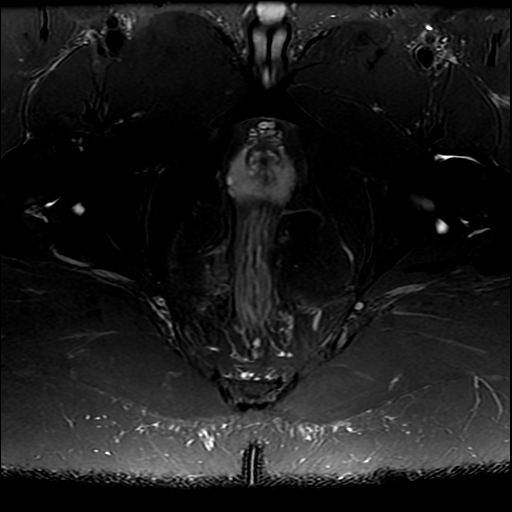
[im 53/53]
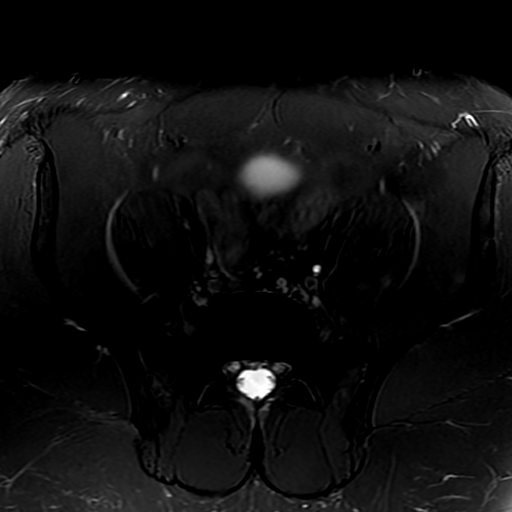

[19 of 48 positions shown; findings below may reference images not displayed]

FINDINGS: Urinary Tract:  Normal urinary bladder.

Bowel: There is no evidence of proctitis. At the 7 o'clock position
of the anus, a transsphincteric fistula (Lsha classification Kusi, [place] classification grade 3) is identified. Example [DATE], [REDACTED], [DATE]. This results in edema within the right ischioanal
fossa without abscess. Example 39/6 and 37/10.

Vascular/Lymphatic: No imaged pelvic adenopathy or sidewall
aneurysm.

Reproductive:  Normal prostate.

Other:  Trace fluid within the right pelvis on [DATE].

Musculoskeletal: No acute osseous abnormality.
IMPRESSION: 1. Diminutive transsphincteric perianal fistula ([place]
classification Jumper, Blain classification grade 3), originating
at the 7 o'clock position. No complicating abscess.
2. Trace right pelvic fluid, possibly secondary.
